# Patient Record
Sex: Female | Born: 1960 | Race: Black or African American | Hispanic: No | Marital: Married | Smoking: Former smoker
Health system: Southern US, Community
[De-identification: ages and names within clinical notes are randomized; demographics above are authoritative.]

## PROBLEM LIST (undated history)

## (undated) DIAGNOSIS — K219 Gastro-esophageal reflux disease without esophagitis: Secondary | ICD-10-CM

## (undated) DIAGNOSIS — R112 Nausea with vomiting, unspecified: Secondary | ICD-10-CM

## (undated) DIAGNOSIS — Z9889 Other specified postprocedural states: Secondary | ICD-10-CM

## (undated) DIAGNOSIS — E119 Type 2 diabetes mellitus without complications: Secondary | ICD-10-CM

## (undated) DIAGNOSIS — E785 Hyperlipidemia, unspecified: Secondary | ICD-10-CM

## (undated) DIAGNOSIS — T7840XA Allergy, unspecified, initial encounter: Secondary | ICD-10-CM

## (undated) HISTORY — DX: Gastro-esophageal reflux disease without esophagitis: K21.9

## (undated) HISTORY — DX: Allergy, unspecified, initial encounter: T78.40XA

## (undated) HISTORY — DX: Other specified postprocedural states: Z98.890

## (undated) HISTORY — DX: Type 2 diabetes mellitus without complications: E11.9

## (undated) HISTORY — DX: Nausea with vomiting, unspecified: R11.2

## (undated) HISTORY — DX: Hyperlipidemia, unspecified: E78.5

---

## 1982-06-24 HISTORY — PX: TUBAL LIGATION: SHX77

## 2012-06-24 HISTORY — PX: LAPAROSCOPIC GASTRIC SLEEVE RESECTION: SHX5895

## 2016-07-08 ENCOUNTER — Encounter: Payer: Managed Care, Other (non HMO) | Attending: Family | Admitting: Registered"

## 2016-07-08 ENCOUNTER — Encounter: Payer: Self-pay | Admitting: Registered"

## 2016-07-08 ENCOUNTER — Encounter: Payer: Managed Care, Other (non HMO) | Admitting: Registered"

## 2016-07-08 DIAGNOSIS — Z713 Dietary counseling and surveillance: Secondary | ICD-10-CM | POA: Diagnosis not present

## 2016-07-08 DIAGNOSIS — E1165 Type 2 diabetes mellitus with hyperglycemia: Secondary | ICD-10-CM | POA: Insufficient documentation

## 2016-07-08 DIAGNOSIS — E119 Type 2 diabetes mellitus without complications: Secondary | ICD-10-CM

## 2016-07-08 NOTE — Progress Notes (Addendum)
Deleted notes. Chose wrong patient to chart visit

## 2016-07-08 NOTE — Patient Instructions (Addendum)
Deleted patient instructions - charted on wrong patient. aj 07/16/2016

## 2016-07-16 NOTE — Progress Notes (Signed)
Diabetes Self-Management Education  Visit Type: First/Initial  Appt. Start Time: 1000 Appt. End Time: 1100  07/08/2016  [name], identified by name and date of birth, is a 56 y.o. female with a diagnosis of Diabetes: Type 2.   ASSESSMENT Patient reports she has made lifestyle changes since her initial diagnosis in November. She started exercising 4x week, cut back on alcohol consumption and joined [employer] end of December. 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      Diabetes Self-Management Education - 07/08/16 1002      Visit Information   Visit Type First/Initial     Initial Visit   Diabetes Type Type 2   Are you currently following a meal plan? Yes   What type of meal plan do you follow? weight watchers freestyle   Are you taking your medications as prescribed? Not on Medications   Date Diagnosed november 2017     Health Coping   How would you rate your overall health? Good     Psychosocial Assessment   Patient Belief/Attitude about Diabetes Motivated to manage diabetes  a little bit of denial   Other persons present Patient     Complications   Last HgB A1C per patient/outside source 9.1 %   How often do you check your blood sugar? 1-2 times/day  morning first thing   Fasting Blood glucose range (mg/dL) 130-179   Have you had a dilated eye exam in the past 12 months? Yes   Have you had a dental exam in the past 12 months? Yes   Are you checking your feet? Yes   How many days per week are you checking your feet? 1  ingrown toenail removed oct 1     Dietary Intake   Breakfast coffee, egg white spinach omelett with cheese OR oatmeal with walnuts, bananas, cinnamon   Snack (morning) nuts and piece of fruit   Lunch left overs OR salad with tuna (biggest meal of day)   Snack (afternoon) none   Dinner (air frying) meat and vegetable and starch OR beans   Snack (evening) fruit / nuts   Beverage(s) coffee, water, crystal  light     Exercise   Exercise Type Moderate (swimming / aerobic walking)   How many days per week to you exercise? 4   How many minutes per day do you exercise? 60   Total minutes per week of exercise 240     Patient Education   Previous Diabetes Education No   Disease state  Definition of diabetes, type 1 and 2, and the diagnosis of diabetes;Factors that contribute to the development of diabetes   Nutrition management  Role of diet in the treatment of diabetes and the relationship between the three main macronutrients and blood glucose level;Food label reading, portion sizes and measuring food.;Carbohydrate counting;Reviewed blood glucose goals for pre and post meals and how to evaluate the patients' food intake on their blood glucose level.   Physical activity and exercise  Role of exercise on diabetes management, blood pressure control and cardiac health.   Monitoring Identified appropriate SMBG and/or A1C goals.;Purpose and frequency of SMBG.;Daily foot exams   Chronic complications Assessed and discussed foot care and prevention of foot problems     Individualized Goals (developed by patient)   Nutrition General guidelines for healthy choices and portions discussed   Physical Activity 45 minutes per day     Outcomes   Expected Outcomes Demonstrated interest  in learning. Expect positive outcomes   Future DMSE PRN   Program Status Completed      Individualized Plan for Diabetes Self-Management Training:   Learning Objective:  Patient will have a greater understanding of diabetes self-management. Patient education plan is to attend individual and/or group sessions per assessed needs and concerns.   Plan:   Patient Instructions  Plan:  Aim for 2-3 Carb Choices per meal Aim for 0-1 Carbs per snack if hungry  Include protein in moderation with your meals and snacks Keep working the carb balance and variety of foods into your Weight Watchers Program Consider reading food labels  for Total Carbohydrate Continue your physical activity daily as tolerated Continue checking BG as directed by MD   Other resources: Look into overnight oats recipe for summertime breakfast   Expected Outcomes:  Demonstrated interest in learning. Expect positive outcomes  Education material provided: Living Well with Diabetes, A1C conversion sheet, Meal plan card and My Plate  If problems or questions, patient to contact team via:  Phone and Email  Future DSME appointment: PRN

## 2016-07-16 NOTE — Patient Instructions (Signed)
Plan:  Aim for 2-3 Carb Choices per meal Aim for 0-1 Carbs per snack if hungry  Include protein in moderation with your meals and snacks Keep working the carb balance and variety of foods into your Weight Watchers Program Consider reading food labels for Total Carbohydrate Continue your physical activity daily as tolerated Continue checking BG as directed by MD   Other resources: Look into overnight oats recipe for summertime breakfast

## 2018-06-19 ENCOUNTER — Other Ambulatory Visit: Payer: Self-pay | Admitting: Nurse Practitioner

## 2018-06-19 DIAGNOSIS — Z1231 Encounter for screening mammogram for malignant neoplasm of breast: Secondary | ICD-10-CM

## 2018-06-19 DIAGNOSIS — Z13820 Encounter for screening for osteoporosis: Secondary | ICD-10-CM

## 2018-07-29 ENCOUNTER — Ambulatory Visit
Admission: RE | Admit: 2018-07-29 | Discharge: 2018-07-29 | Disposition: A | Discharge status: Home / Self Care | Payer: 59 | Source: Ambulatory Visit | Attending: Nurse Practitioner | Admitting: Nurse Practitioner

## 2018-07-29 ENCOUNTER — Ambulatory Visit
Admission: RE | Admit: 2018-07-29 | Discharge: 2018-07-29 | Disposition: A | Discharge status: Home / Self Care | Payer: Managed Care, Other (non HMO) | Source: Ambulatory Visit | Attending: Nurse Practitioner | Admitting: Nurse Practitioner

## 2018-07-29 DIAGNOSIS — Z13820 Encounter for screening for osteoporosis: Secondary | ICD-10-CM

## 2018-07-29 DIAGNOSIS — Z1231 Encounter for screening mammogram for malignant neoplasm of breast: Secondary | ICD-10-CM

## 2018-07-30 ENCOUNTER — Other Ambulatory Visit: Payer: Self-pay | Admitting: Nurse Practitioner

## 2018-07-30 DIAGNOSIS — R928 Other abnormal and inconclusive findings on diagnostic imaging of breast: Secondary | ICD-10-CM

## 2019-06-21 ENCOUNTER — Other Ambulatory Visit: Payer: Self-pay | Admitting: Physician Assistant

## 2019-06-21 ENCOUNTER — Other Ambulatory Visit (HOSPITAL_COMMUNITY)
Admission: RE | Admit: 2019-06-21 | Discharge: 2019-06-21 | Disposition: A | Discharge status: Home / Self Care | Payer: 59 | Source: Ambulatory Visit | Attending: Physician Assistant | Admitting: Physician Assistant

## 2019-06-21 DIAGNOSIS — Z124 Encounter for screening for malignant neoplasm of cervix: Secondary | ICD-10-CM | POA: Insufficient documentation

## 2019-06-24 LAB — CYTOLOGY - PAP: Diagnosis: NEGATIVE

## 2019-09-03 ENCOUNTER — Other Ambulatory Visit: Payer: Self-pay

## 2019-09-03 ENCOUNTER — Other Ambulatory Visit: Payer: Self-pay | Admitting: Physician Assistant

## 2019-09-03 ENCOUNTER — Ambulatory Visit
Admission: RE | Admit: 2019-09-03 | Discharge: 2019-09-03 | Disposition: A | Discharge status: Home / Self Care | Payer: 59 | Source: Ambulatory Visit | Attending: Physician Assistant | Admitting: Physician Assistant

## 2019-09-03 DIAGNOSIS — Z1231 Encounter for screening mammogram for malignant neoplasm of breast: Secondary | ICD-10-CM

## 2019-11-02 ENCOUNTER — Ambulatory Visit (INDEPENDENT_AMBULATORY_CARE_PROVIDER_SITE_OTHER): Payer: 59 | Admitting: Family Medicine

## 2019-11-02 ENCOUNTER — Encounter: Payer: Self-pay | Admitting: Family Medicine

## 2019-11-02 ENCOUNTER — Other Ambulatory Visit: Payer: Self-pay

## 2019-11-02 VITALS — BP 120/70 | HR 67 | Ht 64.0 in | Wt 235.1 lb

## 2019-11-02 DIAGNOSIS — J302 Other seasonal allergic rhinitis: Secondary | ICD-10-CM | POA: Diagnosis not present

## 2019-11-02 DIAGNOSIS — Z7689 Persons encountering health services in other specified circumstances: Secondary | ICD-10-CM

## 2019-11-02 DIAGNOSIS — E119 Type 2 diabetes mellitus without complications: Secondary | ICD-10-CM | POA: Diagnosis not present

## 2019-11-02 MED ORDER — ONE-A-DAY WOMENS PO TABS: 1.0000 | ORAL_TABLET | Freq: Every day | ORAL | 11 refills | Status: AC

## 2019-11-02 MED ORDER — CETIRIZINE HCL 10 MG PO TABS: 10.0000 mg | ORAL_TABLET | Freq: Every day | ORAL | Status: DC

## 2019-11-02 MED ORDER — EMPAGLIFLOZIN 10 MG PO TABS: 10.0000 mg | ORAL_TABLET | Freq: Every day | ORAL | 3 refills | Status: DC

## 2019-11-02 MED ORDER — ASPIRIN EC 81 MG PO TBEC: 81.0000 mg | DELAYED_RELEASE_TABLET | Freq: Every day | ORAL | Status: DC

## 2019-11-02 NOTE — Patient Instructions (Signed)
It was very nice to meet you today. Please enjoy the rest of your week. Today you were seen to establish care and a well exam. Follow up in 3 months for a diabetes check up or sooner if needed.   Please call the clinic at [phone] if your symptoms worsen or you have any concerns. It was our pleasure to serve you.

## 2019-11-02 NOTE — Progress Notes (Signed)
    SUBJECTIVE:   CHIEF COMPLAINT / HPI:   Mrs. Franssen is a 59 yo F that presents to establish care. Her concerns are listed below.  T2DM Average blood sugars are 131 with highs of 145 and lows of 91 according to her meter. She checks her blood sugars daily. She is not taking a statin medication because of the fear of side effects. She denies vision changes, polyphagia, polyuria, polydipsia, and paresthesias.   PERTINENT  PMH / PSH: Seasonal allergies  OBJECTIVE:   BP 120/70   Pulse 67   Ht 5\' 4"  (1.626 m)   Wt 235 lb 2 oz (106.7 kg)   SpO2 97%   BMI 40.36 kg/m    General: Appears well, no acute distress. Age appropriate. Cardiac: RRR, normal heart sounds, no murmurs Respiratory: CTAB, normal effort Extremities: No edema or cyanosis. Skin: Warm and dry, no rashes noted Psych: normal affect  ASSESSMENT/PLAN:   Diabetes mellitus without complication (HCC) Last hgb A1c was 10/18/2019 7.9; goal <7. BMP wnl. LDL 190.  Statin naive. LDL goal is <70. ASCVD risk is 9.9%.  -Continue Jardiance 10mg  daily -Consider metformin, GLP-1 -Counsel on statin use at follow up appt -Follow up in 3 months for diabetes check up  Encounter to establish care Patient UTD on pap, mammogram, and DEXA scan -Signed records release form -Consider referral for colonoscopy if records not available    Gerlene Fee, [place]

## 2019-11-03 LAB — BASIC METABOLIC PANEL
BUN/Creatinine Ratio: 23 (ref 9–23)
BUN: 20 mg/dL (ref 6–24)
CO2: 23 mmol/L (ref 20–29)
Calcium: 9.3 mg/dL (ref 8.7–10.2)
Chloride: 104 mmol/L (ref 96–106)
Creatinine, Ser: 0.88 mg/dL (ref 0.57–1.00)
GFR calc Af Amer: 84 mL/min/{1.73_m2} (ref >59)
GFR calc non Af Amer: 73 mL/min/{1.73_m2} (ref >59)
Glucose: 112 mg/dL — ABNORMAL HIGH (ref 65–99)
Potassium: 4.7 mmol/L (ref 3.5–5.2)
Sodium: 142 mmol/L (ref 134–144)

## 2019-11-03 LAB — LIPID PANEL
Chol/HDL Ratio: 4.3 ratio (ref 0.0–4.4)
Cholesterol, Total: 273 mg/dL — ABNORMAL HIGH (ref 100–199)
HDL: 63 mg/dL (ref >39)
LDL Chol Calc (NIH): 190 mg/dL — ABNORMAL HIGH (ref 0–99)
Triglycerides: 116 mg/dL (ref 0–149)
VLDL Cholesterol Cal: 20 mg/dL (ref 5–40)

## 2019-11-08 DIAGNOSIS — Z7689 Persons encountering health services in other specified circumstances: Secondary | ICD-10-CM | POA: Insufficient documentation

## 2019-11-08 NOTE — Assessment & Plan Note (Signed)
Patient UTD on pap, mammogram, and DEXA scan -Signed records release form -Consider referral for colonoscopy if records not available

## 2019-11-08 NOTE — Assessment & Plan Note (Signed)
Last hgb A1c was 10/18/2019 7.9; goal <7. BMP wnl. LDL 190.  Statin naive. LDL goal is <70. ASCVD risk is 9.9%.  -Continue Jardiance 10mg  daily -Consider metformin, GLP-1 -Counsel on statin use at follow up appt -Follow up in 3 months for diabetes check up

## 2020-10-10 LAB — HM DIABETES EYE EXAM

## 2020-11-11 ENCOUNTER — Other Ambulatory Visit: Payer: Self-pay | Admitting: Family Medicine

## 2020-11-11 DIAGNOSIS — E119 Type 2 diabetes mellitus without complications: Secondary | ICD-10-CM

## 2020-11-13 ENCOUNTER — Other Ambulatory Visit: Payer: Self-pay

## 2020-11-13 ENCOUNTER — Other Ambulatory Visit: Payer: Self-pay | Admitting: Family Medicine

## 2020-11-13 DIAGNOSIS — Z1231 Encounter for screening mammogram for malignant neoplasm of breast: Secondary | ICD-10-CM

## 2020-11-13 NOTE — Telephone Encounter (Signed)
Patient needs appointment. No documented A1c >1 year.   [place], DO 11/13/2020, 10:51 AM PGY-2, [place]

## 2020-12-12 ENCOUNTER — Other Ambulatory Visit: Payer: Self-pay | Admitting: Family Medicine

## 2020-12-12 DIAGNOSIS — E119 Type 2 diabetes mellitus without complications: Secondary | ICD-10-CM

## 2021-01-01 ENCOUNTER — Ambulatory Visit (INDEPENDENT_AMBULATORY_CARE_PROVIDER_SITE_OTHER): Payer: 59 | Admitting: Family Medicine

## 2021-01-01 ENCOUNTER — Other Ambulatory Visit: Payer: Self-pay

## 2021-01-01 VITALS — BP 145/84 | HR 63 | Ht 64.0 in | Wt 238.6 lb

## 2021-01-01 DIAGNOSIS — Z1211 Encounter for screening for malignant neoplasm of colon: Secondary | ICD-10-CM | POA: Diagnosis not present

## 2021-01-01 DIAGNOSIS — E785 Hyperlipidemia, unspecified: Secondary | ICD-10-CM

## 2021-01-01 DIAGNOSIS — Z0000 Encounter for general adult medical examination without abnormal findings: Secondary | ICD-10-CM

## 2021-01-01 DIAGNOSIS — E119 Type 2 diabetes mellitus without complications: Secondary | ICD-10-CM

## 2021-01-01 DIAGNOSIS — Z23 Encounter for immunization: Secondary | ICD-10-CM

## 2021-01-01 DIAGNOSIS — R030 Elevated blood-pressure reading, without diagnosis of hypertension: Secondary | ICD-10-CM | POA: Diagnosis not present

## 2021-01-01 DIAGNOSIS — E1169 Type 2 diabetes mellitus with other specified complication: Secondary | ICD-10-CM

## 2021-01-01 LAB — POCT GLYCOSYLATED HEMOGLOBIN (HGB A1C): HbA1c, POC (controlled diabetic range): 9 % — AB (ref 0.0–7.0)

## 2021-01-01 MED ORDER — SHINGRIX 50 MCG/0.5ML IM SUSR: 0.5000 mL | Freq: Once | INTRAMUSCULAR | 0 refills | Status: AC

## 2021-01-01 MED ORDER — OZEMPIC (0.25 OR 0.5 MG/DOSE) 2 MG/1.5ML ~~LOC~~ SOPN: 0.2500 mg | PEN_INJECTOR | SUBCUTANEOUS | 1 refills | Status: DC

## 2021-01-01 MED ORDER — BD PEN NEEDLE MINI U/F 31G X 5 MM MISC: 1.0000 | 11 refills | Status: DC | PRN
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
## 2021-01-01 NOTE — Patient Instructions (Addendum)
Today we discussed we will start semaglutide 0.25 mg weekly.  He will follow-up with Ernst Bowler our pharmacist in 1 month.  I have referred you for colonoscopy.  Please take shingles prescription to pharmacy to get vaccine.  Follow-up with me in 3 months for repeat A1c.  Your blood pressure was a little elevated today we will recheck at follow-up.  Dr. Janus Molder

## 2021-01-01 NOTE — Progress Notes (Signed)
Asked by Dr. Janus Molder to provide education on Ozempic pen injection.   Patient educated on purpose, proper use and potential adverse effects of Ozempic (semaglutide).  Following instruction patient verbalized understanding of treatment plan. Patient was able to demonstrate appropriate technique - she states her husband will be providing the injection as she has some fear of needles/injection (8/10).     New prescription for insulin pen needles sent to pharmacy.

## 2021-01-02 LAB — LIPID PANEL
Chol/HDL Ratio: 4 ratio (ref 0.0–4.4)
Cholesterol, Total: 283 mg/dL — ABNORMAL HIGH (ref 100–199)
HDL: 70 mg/dL (ref >39)
LDL Chol Calc (NIH): 198 mg/dL — ABNORMAL HIGH (ref 0–99)
Triglycerides: 92 mg/dL (ref 0–149)
VLDL Cholesterol Cal: 15 mg/dL (ref 5–40)

## 2021-01-02 LAB — HIV ANTIBODY (ROUTINE TESTING W REFLEX): HIV Screen 4th Generation wRfx: NONREACTIVE

## 2021-01-02 LAB — HCV AB W REFLEX TO QUANT PCR: HCV Ab: <0.1 s/co ratio (ref 0.0–0.9)

## 2021-01-02 LAB — HCV INTERPRETATION

## 2021-01-03 LAB — BASIC METABOLIC PANEL
BUN/Creatinine Ratio: 19 (ref 9–23)
BUN: 16 mg/dL (ref 6–24)
CO2: 19 mmol/L — ABNORMAL LOW (ref 20–29)
Calcium: 9.4 mg/dL (ref 8.7–10.2)
Chloride: 101 mmol/L (ref 96–106)
Creatinine, Ser: 0.85 mg/dL (ref 0.57–1.00)
Glucose: 123 mg/dL — ABNORMAL HIGH (ref 65–99)
Potassium: 4.4 mmol/L (ref 3.5–5.2)
Sodium: 139 mmol/L (ref 134–144)
eGFR: 79 mL/min/{1.73_m2} (ref >59)

## 2021-01-03 LAB — SPECIMEN STATUS REPORT

## 2021-01-04 DIAGNOSIS — R030 Elevated blood-pressure reading, without diagnosis of hypertension: Secondary | ICD-10-CM | POA: Insufficient documentation

## 2021-01-04 NOTE — Assessment & Plan Note (Signed)
BP reviewed and initially elevated at 130/80. Continued to be elevated upon recheck as above. No diagnosis of HTN. States is normal at home and usually high in the office. Asymptomatic. Will recheck at follow up. Return to care precautions discussed.

## 2021-01-04 NOTE — Assessment & Plan Note (Addendum)
A1c today 9.0. Up from 7.9. Patient is taking Jardiance 10 mg daily. She refused to take metformin due to GI upset in the past. Open to injectables. Discussed starting ozempic with Dr. Valentina Lucks. Appreciate his note. Discussed diet and physical activity with patient as well.  -Start semaglutide 0.25mg  weekly - Continue jardiance 10 mg daily; consider maxing out this medication if needed for tighter control - HM Diabetes Foot Exam - Basic Metabolic Panel - Lipid Panel -Follow up in 1 month with pharmacy to titrate up on ozempic

## 2021-01-08 ENCOUNTER — Encounter: Payer: Self-pay | Admitting: Gastroenterology

## 2021-01-08 ENCOUNTER — Telehealth: Payer: Self-pay | Admitting: Gastroenterology

## 2021-01-08 NOTE — Telephone Encounter (Signed)
Opened in error

## 2021-01-09 ENCOUNTER — Other Ambulatory Visit: Payer: Self-pay

## 2021-01-09 ENCOUNTER — Ambulatory Visit
Admission: RE | Admit: 2021-01-09 | Discharge: 2021-01-09 | Disposition: A | Discharge status: Home / Self Care | Payer: 59 | Source: Ambulatory Visit

## 2021-01-09 DIAGNOSIS — Z1231 Encounter for screening mammogram for malignant neoplasm of breast: Secondary | ICD-10-CM

## 2021-01-11 ENCOUNTER — Telehealth: Payer: Self-pay | Admitting: Family Medicine

## 2021-01-11 DIAGNOSIS — E119 Type 2 diabetes mellitus without complications: Secondary | ICD-10-CM

## 2021-01-11 DIAGNOSIS — E785 Hyperlipidemia, unspecified: Secondary | ICD-10-CM

## 2021-01-11 DIAGNOSIS — E1169 Type 2 diabetes mellitus with other specified complication: Secondary | ICD-10-CM

## 2021-01-11 MED ORDER — ATORVASTATIN CALCIUM 40 MG PO TABS: 40.0000 mg | ORAL_TABLET | Freq: Every day | ORAL | 3 refills | Status: DC

## 2021-01-11 NOTE — Addendum Note (Signed)
Addended by: Caralee Ates on: 01/11/2021 10:41 AM   Modules accepted: Orders

## 2021-01-11 NOTE — Telephone Encounter (Signed)
Called patient to discuss cholesterol panel.  We will start Lipitor 40 mg daily.  Answered questions of concern such as side effects and benefits. Discussed follow-up in 3 months we will assess A1c, how she is doing starting the statin, as well as insurance coverage for shingles vaccine.  Peng Thorstenson Autry-Lott, DO 01/11/2021, 10:39 AM PGY-3, [place]

## 2021-01-13 ENCOUNTER — Other Ambulatory Visit: Payer: Self-pay | Admitting: Family Medicine

## 2021-01-13 DIAGNOSIS — E119 Type 2 diabetes mellitus without complications: Secondary | ICD-10-CM

## 2021-02-18 ENCOUNTER — Other Ambulatory Visit: Payer: Self-pay | Admitting: Family Medicine

## 2021-02-18 DIAGNOSIS — E119 Type 2 diabetes mellitus without complications: Secondary | ICD-10-CM

## 2021-03-01 ENCOUNTER — Other Ambulatory Visit: Payer: Self-pay | Admitting: Family Medicine

## 2021-03-01 DIAGNOSIS — E119 Type 2 diabetes mellitus without complications: Secondary | ICD-10-CM

## 2021-03-09 ENCOUNTER — Encounter: Payer: 59 | Admitting: Gastroenterology

## 2021-03-09 ENCOUNTER — Encounter: Payer: Self-pay | Admitting: *Deleted

## 2021-03-18 ENCOUNTER — Other Ambulatory Visit: Payer: Self-pay | Admitting: Family Medicine

## 2021-03-18 DIAGNOSIS — E119 Type 2 diabetes mellitus without complications: Secondary | ICD-10-CM

## 2021-03-27 ENCOUNTER — Ambulatory Visit (AMBULATORY_SURGERY_CENTER): Payer: 59 | Admitting: *Deleted

## 2021-03-27 ENCOUNTER — Other Ambulatory Visit: Payer: Self-pay

## 2021-03-27 VITALS — Ht 64.0 in | Wt 235.0 lb

## 2021-03-27 DIAGNOSIS — Z1211 Encounter for screening for malignant neoplasm of colon: Secondary | ICD-10-CM

## 2021-03-27 MED ORDER — CLENPIQ 10-3.5-12 MG-GM -GM/160ML PO SOLN: 1.0000 | ORAL | 0 refills | Status: DC

## 2021-03-27 NOTE — Progress Notes (Signed)
No egg or soy allergy known to patient  No issues known to pt with past sedation with any surgeries or procedures   Patient denies ever being told they had issues or difficulty with intubation  No FH of Malignant Hyperthermia Pt is not on diet pills Pt is not on  home 02  Pt is not on blood thinners  Pt denies issues with constipation  No A fib or A flutter   Pt is fully vaccinated  for Covid   clenpiq Coupon given to pt in PV today , Code to Pharmacy and  NO PA's for preps discussed with pt In PV today  Discussed with pt there will be an out-of-pocket cost for prep and that varies from $0 to 70 +  dollars   Due to the COVID-19 pandemic we are asking patients to follow certain guidelines.  Pt aware of COVID protocols and [facility] guidelines   Pt verified name, DOB, address and insurance during PV today.  Pt mailed instruction packet of Emmi video, copy of consent form to read and not return, and instructions.  Clenpiq coupon mailed in packet.  PV completed over the phone.  Pt encouraged to call with questions or issues.  My Chart instructions to pt as well

## 2021-03-30 ENCOUNTER — Encounter: Payer: Self-pay | Admitting: Gastroenterology

## 2021-04-09 ENCOUNTER — Ambulatory Visit (AMBULATORY_SURGERY_CENTER): Payer: 59 | Admitting: Gastroenterology

## 2021-04-09 ENCOUNTER — Encounter: Payer: Self-pay | Admitting: Gastroenterology

## 2021-04-09 VITALS — BP 160/98 | HR 67 | Temp 97.5°F | Resp 14 | Ht 64.0 in | Wt 235.0 lb

## 2021-04-09 DIAGNOSIS — D128 Benign neoplasm of rectum: Secondary | ICD-10-CM

## 2021-04-09 DIAGNOSIS — K621 Rectal polyp: Secondary | ICD-10-CM | POA: Diagnosis not present

## 2021-04-09 DIAGNOSIS — Z1211 Encounter for screening for malignant neoplasm of colon: Secondary | ICD-10-CM | POA: Diagnosis present

## 2021-04-09 MED ORDER — SODIUM CHLORIDE 0.9 % IV SOLN: 500.0000 mL | Freq: Once | INTRAVENOUS | Status: DC

## 2021-04-09 MED ORDER — FLEET ENEMA 7-19 GM/118ML RE ENEM
1.0000 | ENEMA | Freq: Once | RECTAL | Status: AC
  Administered 2021-04-09: 1 via RECTAL

## 2021-04-09 NOTE — Patient Instructions (Signed)
Information on polyps given to you today.  Await pathology results.  Resume previous diet and medications.  YOU HAD AN ENDOSCOPIC PROCEDURE TODAY AT THE [facility]:   Refer to the procedure report that was given to you for any specific questions about what was found during the examination.  If the procedure report does not answer your questions, please call your gastroenterologist to clarify.  If you requested that your care partner not be given the details of your procedure findings, then the procedure report has been included in a sealed envelope for you to review at your convenience later.  YOU SHOULD EXPECT: Some feelings of bloating in the abdomen. Passage of more gas than usual.  Walking can help get rid of the air that was put into your GI tract during the procedure and reduce the bloating. If you had a lower endoscopy (such as a colonoscopy or flexible sigmoidoscopy) you may notice spotting of blood in your stool or on the toilet paper. If you underwent a bowel prep for your procedure, you may not have a normal bowel movement for a few days.  Please Note:  You might notice some irritation and congestion in your nose or some drainage.  This is from the oxygen used during your procedure.  There is no need for concern and it should clear up in a day or so.  SYMPTOMS TO REPORT IMMEDIATELY:   Following lower endoscopy (colonoscopy or flexible sigmoidoscopy):  Excessive amounts of blood in the stool  Significant tenderness or worsening of abdominal pains  Swelling of the abdomen that is new, acute  Fever of 100F or higher   For urgent or emergent issues, a gastroenterologist can be reached at any hour by calling [phone]. Do not use MyChart messaging for urgent concerns.    DIET:  We do recommend a small meal at first, but then you may proceed to your regular diet.  Drink plenty of fluids but you should avoid alcoholic beverages for 24 hours.  ACTIVITY:  You should  plan to take it easy for the rest of today and you should NOT DRIVE or use heavy machinery until tomorrow (because of the sedation medicines used during the test).    FOLLOW UP: Our staff will call the number listed on your records 48-72 hours following your procedure to check on you and address any questions or concerns that you may have regarding the information given to you following your procedure. If we do not reach you, we will leave a message.  We will attempt to reach you two times.  During this call, we will ask if you have developed any symptoms of COVID 19. If you develop any symptoms (ie: fever, flu-like symptoms, shortness of breath, cough etc.) before then, please call [phone].  If you test positive for Covid 19 in the 2 weeks post procedure, please call and report this information to us.    If any biopsies were taken you will be contacted by phone or by letter within the next 1-3 weeks.  Please call us at [phone] if you have not heard about the biopsies in 3 weeks.    SIGNATURES/CONFIDENTIALITY: You and/or your care partner have signed paperwork which will be entered into your electronic medical record.  These signatures attest to the fact that that the information above on your After Visit Summary has been reviewed and is understood.  Full responsibility of the confidentiality of this discharge information lies with you and/or your care-partner. 

## 2021-04-09 NOTE — Progress Notes (Signed)
To PACU, VSS. Report to [email or web address].tb 

## 2021-04-09 NOTE — Progress Notes (Signed)
Called to room to assist during endoscopic procedure.  Patient ID and intended procedure confirmed with present staff. Received instructions for my participation in the procedure from the performing physician.  

## 2021-04-09 NOTE — Progress Notes (Signed)
Pt's states no medical or surgical changes since previsit or office visit.  Verbal order received to give enema x1,given in admitting results was clear with fine small pieces at bottom of toilet.  Check-in-AM  V/S-CW

## 2021-04-09 NOTE — Op Note (Signed)
[place] Patient Name: Shelley Klein Procedure Date: 04/09/2021 10:53 AM MRN: [id number] Endoscopist: Nicki Reaper E. Candis Schatz , MD Age: 60 Referring MD:  Date of Birth: Nov 01, 1960 Gender: Female Account #: [id number] Procedure:                Colonoscopy Indications:              Screening for colorectal malignant neoplasm, This                            is the patient's first colonoscopy Medicines:                Monitored Anesthesia Care Procedure:                Pre-Anesthesia Assessment:                           - Prior to the procedure, a History and Physical                            was performed, and patient medications and                            allergies were reviewed. The patient's tolerance of                            previous anesthesia was also reviewed. The risks                            and benefits of the procedure and the sedation                            options and risks were discussed with the patient.                            All questions were answered, and informed consent                            was obtained. Prior Anticoagulants: The patient has                            taken no previous anticoagulant or antiplatelet                            agents. ASA Grade Assessment: II - A patient with                            mild systemic disease. After reviewing the risks                            and benefits, the patient was deemed in                            satisfactory condition to undergo the procedure.  After obtaining informed consent, the colonoscope                            was passed under direct vision. Throughout the                            procedure, the patient's blood pressure, pulse, and                            oxygen saturations were monitored continuously. The                            CF [id number] #[id number] was introduced through the anus                            and advanced  to the the terminal ileum, with                            identification of the appendiceal orifice and IC                            valve. The colonoscopy was performed without                            difficulty. The patient tolerated the procedure                            well. The quality of the bowel preparation was                            adequate. The terminal ileum, ileocecal valve,                            appendiceal orifice, and rectum were photographed. Scope In: 10:59:43 AM Scope Out: 11:19:06 AM Scope Withdrawal Time: 0 hours 14 minutes 5 seconds  Total Procedure Duration: 0 hours 19 minutes 23 seconds  Findings:                 The perianal and digital rectal examinations were                            normal. Pertinent negatives include normal                            sphincter tone and no palpable rectal lesions.                           A 3 mm polyp was found in the rectum. The polyp was                            sessile. The polyp was removed with a cold snare.                            Resection and  retrieval were complete. Estimated                            blood loss was minimal.                           The exam was otherwise normal throughout the                            examined colon.                           The terminal ileum appeared normal.                           The retroflexed view of the distal rectum and anal                            verge was normal and showed no anal or rectal                            abnormalities. Complications:            No immediate complications. Estimated Blood Loss:     Estimated blood loss was minimal. Impression:               - One 3 mm polyp in the rectum, removed with a cold                            snare. Resected and retrieved.                           - The examined portion of the ileum was normal.                           - The distal rectum and anal verge are normal on                             retroflexion view. Recommendation:           - Patient has a contact number available for                            emergencies. The signs and symptoms of potential                            delayed complications were discussed with the                            patient. Return to normal activities tomorrow.                            Written discharge instructions were provided to the                            patient.                           -  Resume previous diet.                           - Continue present medications.                           - Await pathology results.                           - Repeat colonoscopy (date not yet determined) for                            surveillance based on pathology results. Avarae Zwart E. Candis Schatz, MD 04/09/2021 11:23:20 AM This report has been signed electronically.

## 2021-04-09 NOTE — Progress Notes (Signed)
[facility] History and Physical   Primary Care Physician:  Gerlene Fee, DO   Reason for Procedure:   Colon cancer screening  Plan:    Screening colonoscopy     HPI: Shelley Klein is a 60 y.o. female undergoing initial average risk screening colonoscopy.  She has no chronic GI symptoms and no family history of colon cancer.   Past Medical History:  Diagnosis Date   Allergy    Diabetes mellitus without complication ([place])    type II   GERD (gastroesophageal reflux disease)    prn TUMS with spicy foods   Hyperlipidemia    PONV (postoperative nausea and vomiting)     Past Surgical History:  Procedure Laterality Date   LAPAROSCOPIC GASTRIC SLEEVE RESECTION  2014   TUBAL LIGATION  1984    Prior to Admission medications   Medication Sig Start Date End Date Taking? Authorizing Provider  atorvastatin (LIPITOR) 40 MG tablet Take 1 tablet (40 mg total) by mouth daily. 01/11/21  Yes Autry-Lott, Simone, DO  JARDIANCE 10 MG TABS tablet TAKE 1 TABLET BY MOUTH EVERY DAY 03/19/21  Yes Autry-Lott, Naaman Plummer, DO  cetirizine (ZYRTEC ALLERGY) 10 MG tablet Take 1 tablet (10 mg total) by mouth daily. Patient not taking: No sig reported 11/02/19   Autry-Lott, Naaman Plummer, DO  Insulin Pen Needle (B-D UF III MINI PEN NEEDLES) 31G X 5 MM MISC 1 Container by Does not apply route as needed. 01/01/21   Zenia Resides, MD  OZEMPIC, 0.25 OR 0.5 MG/DOSE, 2 MG/1.5ML SOPN INJECT 0.25MG  INTO THE SKIN ONE TIME PER WEEK 03/01/21   Autry-Lott, Naaman Plummer, DO    Current Outpatient Medications  Medication Sig Dispense Refill   atorvastatin (LIPITOR) 40 MG tablet Take 1 tablet (40 mg total) by mouth daily. 90 tablet 3   JARDIANCE 10 MG TABS tablet TAKE 1 TABLET BY MOUTH EVERY DAY 90 tablet 0   cetirizine (ZYRTEC ALLERGY) 10 MG tablet Take 1 tablet (10 mg total) by mouth daily. (Patient not taking: No sig reported)     Insulin Pen Needle (B-D UF III MINI PEN NEEDLES) 31G X 5 MM MISC 1 Container by Does  not apply route as needed. 100 each 11   OZEMPIC, 0.25 OR 0.5 MG/DOSE, 2 MG/1.5ML SOPN INJECT 0.25MG  INTO THE SKIN ONE TIME PER WEEK 1.5 mL 1   Current Facility-Administered Medications  Medication Dose Route Frequency Provider Last Rate Last Admin   0.9 %  sodium chloride infusion  500 mL Intravenous Once Daryel November, MD        Allergies as of 04/09/2021 - Review Complete 04/09/2021  Allergen Reaction Noted   Shellfish-derived products Swelling 09/08/2012   Amoxicillin-pot clavulanate Rash 05/26/2012    Family History  Problem Relation Age of Onset   Diabetes Mother    High blood pressure Mother    Stroke Mother    Diabetes Sister        several   High blood pressure Sister        several   Drug abuse Sister        one sister with addiction   Prostate cancer Brother    Diabetes Brother        several   High blood pressure Brother        several   Kidney disease Brother        one brother   Colon cancer Neg Hx    Colon polyps Neg Hx    Esophageal cancer Neg Hx  Rectal cancer Neg Hx    Stomach cancer Neg Hx     Social History   Socioeconomic History   Marital status: Married    Spouse name: Not on file   Number of children: Not on file   Years of education: Not on file   Highest education level: Not on file  Occupational History    Comment: [employer]  Tobacco Use   Smoking status: Former    Types: Cigarettes    Quit date: 05/24/1996    Years since quitting: 24.8   Smokeless tobacco: Never  Substance and Sexual Activity   Alcohol use: Yes    Comment: 3, wine and liquor   Drug use: Never   Sexual activity: Not on file  Other Topics Concern   Not on file  Social History Narrative   Not on file   Social Determinants of Health   Financial Resource Strain: Not on file  Food Insecurity: Not on file  Transportation Needs: Not on file  Physical Activity: Not on file  Stress: Not on file  Social Connections: Not on file  Intimate  Partner Violence: Not on file    Review of Systems:  All other review of systems negative except as mentioned in the HPI.  Physical Exam: Vital signs BP (!) 144/91 (Patient Position: Sitting)   Pulse 74   Temp (!) 97.5 F (36.4 C)   Ht 5\' 4"  (1.626 m)   Wt 235 lb (106.6 kg)   SpO2 99%   BMI 40.34 kg/m   General:   Alert,  Well-developed, well-nourished, pleasant and cooperative in NAD Lungs:  Clear throughout to auscultation.   Heart:  Regular rate and rhythm; no murmurs, clicks, rubs,  or gallops. Abdomen:  Soft, nontender and nondistended. Normal bowel sounds.   Neuro/Psych:  Normal mood and affect. A and O x 3   Argelia Formisano E. Candis Schatz, MD [facility]

## 2021-04-11 ENCOUNTER — Telehealth: Payer: Self-pay

## 2021-04-11 NOTE — Telephone Encounter (Signed)
  Follow up Call-  Call back number 04/09/2021  Post procedure Call Back phone  # [phone]  Permission to leave phone message Yes  Some recent data might be hidden     Patient questions:  Do you have a fever, pain , or abdominal swelling? No. Pain Score  0 *  Have you tolerated food without any problems? Yes.    Have you been able to return to your normal activities? Yes.    Do you have any questions about your discharge instructions: Diet   No. Medications  No. Follow up visit  No.  Do you have questions or concerns about your Care? No.  Actions: * If pain score is 4 or above: No action needed, pain <4.

## 2021-04-17 ENCOUNTER — Encounter: Payer: Self-pay | Admitting: Gastroenterology

## 2021-04-26 ENCOUNTER — Other Ambulatory Visit: Payer: Self-pay | Admitting: Family Medicine

## 2021-04-26 DIAGNOSIS — E119 Type 2 diabetes mellitus without complications: Secondary | ICD-10-CM

## 2021-06-21 ENCOUNTER — Other Ambulatory Visit: Payer: Self-pay | Admitting: Family Medicine

## 2021-06-21 DIAGNOSIS — E119 Type 2 diabetes mellitus without complications: Secondary | ICD-10-CM

## 2021-06-27 ENCOUNTER — Other Ambulatory Visit: Payer: Self-pay | Admitting: Family Medicine

## 2021-06-27 DIAGNOSIS — E119 Type 2 diabetes mellitus without complications: Secondary | ICD-10-CM

## 2021-08-25 ENCOUNTER — Other Ambulatory Visit: Payer: Self-pay | Admitting: Family Medicine

## 2021-08-25 DIAGNOSIS — E119 Type 2 diabetes mellitus without complications: Secondary | ICD-10-CM

## 2021-09-20 ENCOUNTER — Other Ambulatory Visit: Payer: Self-pay | Admitting: Family Medicine

## 2021-09-20 DIAGNOSIS — E119 Type 2 diabetes mellitus without complications: Secondary | ICD-10-CM

## 2021-11-27 ENCOUNTER — Other Ambulatory Visit: Payer: Self-pay | Admitting: Family Medicine

## 2021-11-27 ENCOUNTER — Encounter: Payer: Self-pay | Admitting: *Deleted

## 2021-11-27 DIAGNOSIS — Z1231 Encounter for screening mammogram for malignant neoplasm of breast: Secondary | ICD-10-CM

## 2021-12-22 ENCOUNTER — Other Ambulatory Visit: Payer: Self-pay | Admitting: Family Medicine

## 2021-12-22 DIAGNOSIS — E119 Type 2 diabetes mellitus without complications: Secondary | ICD-10-CM

## 2022-01-03 ENCOUNTER — Other Ambulatory Visit: Payer: Self-pay | Admitting: Family Medicine

## 2022-01-03 DIAGNOSIS — E119 Type 2 diabetes mellitus without complications: Secondary | ICD-10-CM

## 2022-01-03 DIAGNOSIS — E1169 Type 2 diabetes mellitus with other specified complication: Secondary | ICD-10-CM

## 2022-01-04 IMAGING — MG MM DIGITAL SCREENING BILAT W/ TOMO AND CAD
8 series · 8 of 24 positions shown · non-contrast
Comparison: Previous exam(s).

CLINICAL DATA: Screening.

EXAM:
DIGITAL SCREENING BILATERAL MAMMOGRAM WITH TOMOSYNTHESIS AND CAD
TECHNIQUE: Bilateral screening digital craniocaudal and mediolateral oblique
mammograms were obtained. Bilateral screening digital breast
tomosynthesis was performed. The images were evaluated with
computer-aided detection.

[L CC synth-2D]
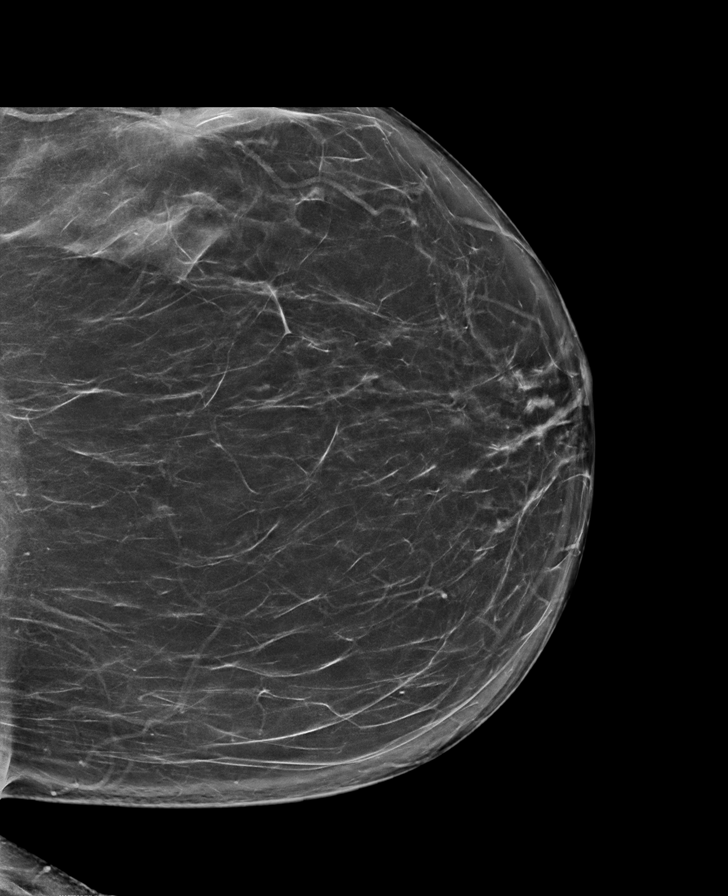

[R MLO synth-2D]
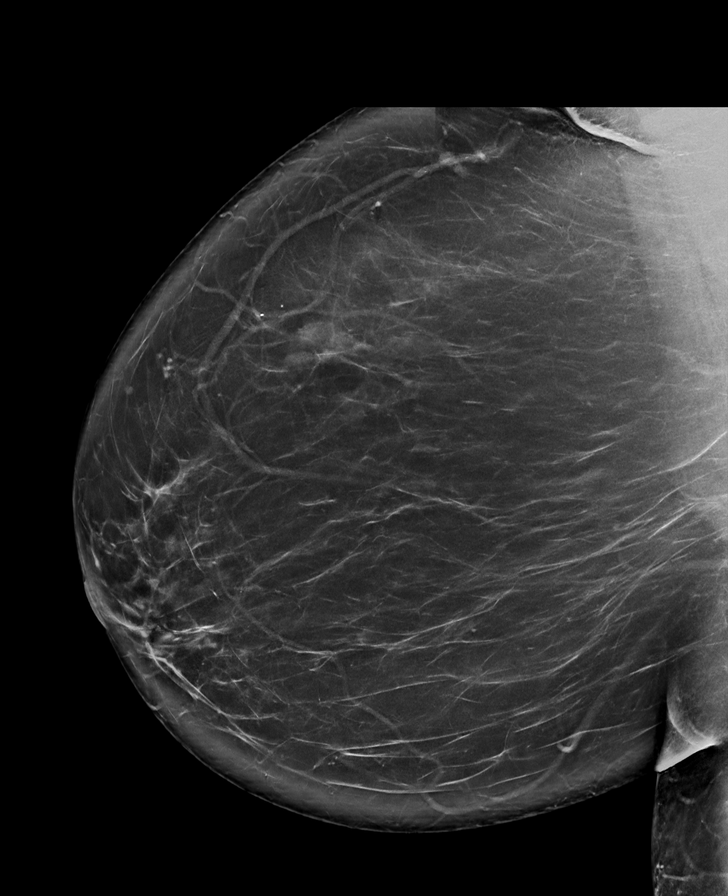

[L MLO synth-2D]
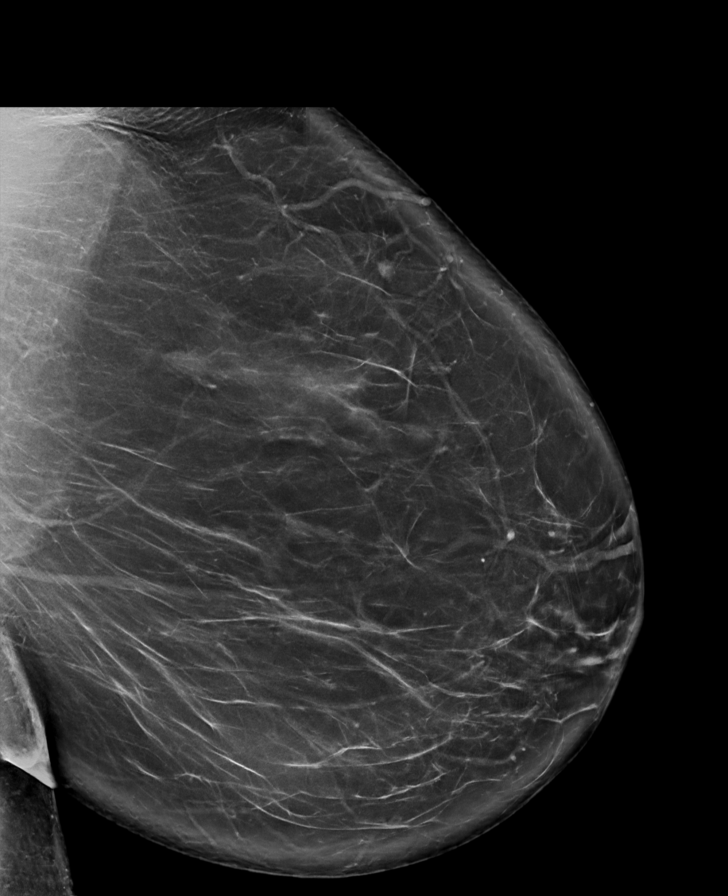

[R CC synth-2D]
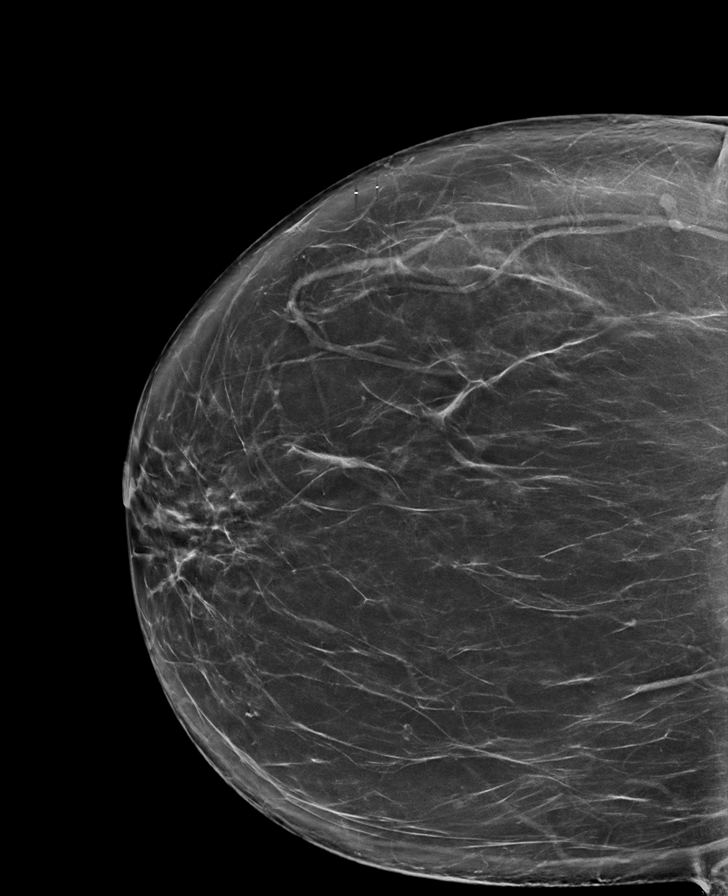

[R MLO tomo · tomo slice 53/104.0]
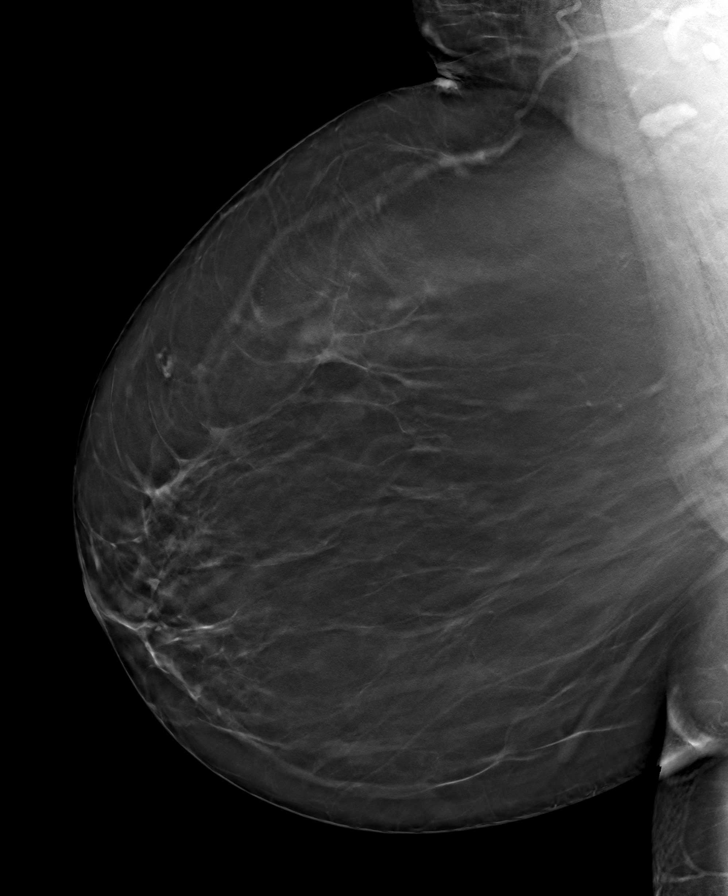

[R CC tomo · tomo slice 46/91.0]
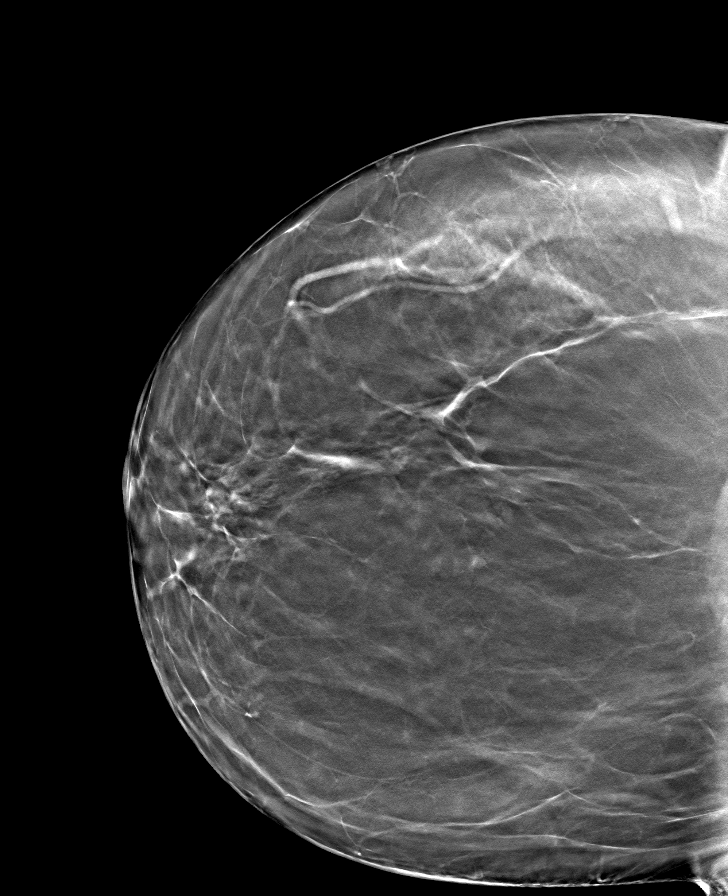

[L MLO tomo · tomo slice 53/105.0]
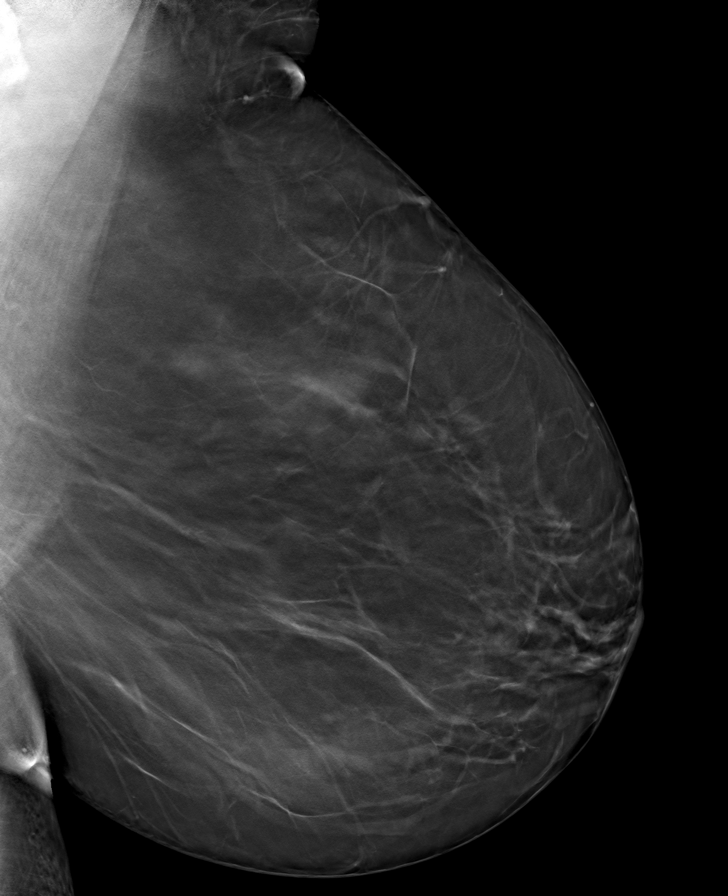

[L CC tomo · tomo slice 44/87.0]
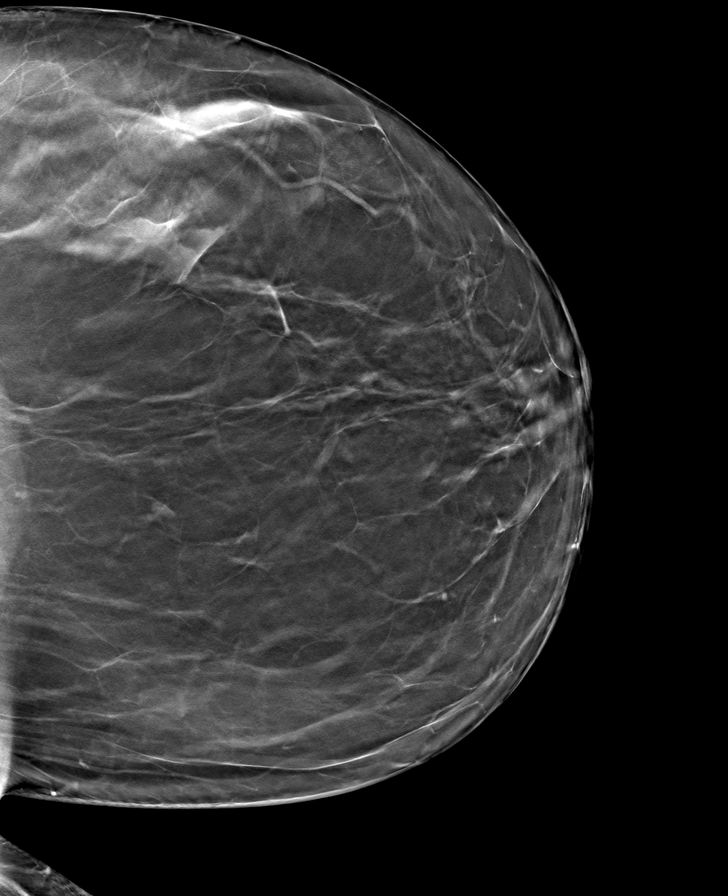

[8 of 24 positions shown; findings below may reference images not displayed]

ACR Breast Density Category b: There are scattered areas of
fibroglandular density.
FINDINGS: There are no findings suspicious for malignancy.
IMPRESSION: No mammographic evidence of malignancy. A result letter of this
screening mammogram will be mailed directly to the patient.

RECOMMENDATION:
Screening mammogram in one year. (Code:51-O-LD2)

BI-RADS CATEGORY  1: Negative.

## 2022-01-10 ENCOUNTER — Ambulatory Visit
Admission: RE | Admit: 2022-01-10 | Discharge: 2022-01-10 | Disposition: A | Discharge status: Home / Self Care | Payer: 59 | Source: Ambulatory Visit | Attending: Family Medicine | Admitting: Family Medicine

## 2022-01-10 DIAGNOSIS — Z1231 Encounter for screening mammogram for malignant neoplasm of breast: Secondary | ICD-10-CM

## 2022-03-04 ENCOUNTER — Other Ambulatory Visit: Payer: Self-pay | Admitting: Family Medicine

## 2022-03-04 DIAGNOSIS — E119 Type 2 diabetes mellitus without complications: Secondary | ICD-10-CM

## 2022-03-05 ENCOUNTER — Ambulatory Visit (INDEPENDENT_AMBULATORY_CARE_PROVIDER_SITE_OTHER): Payer: 59 | Admitting: Student

## 2022-03-05 ENCOUNTER — Encounter: Payer: Self-pay | Admitting: Student

## 2022-03-05 VITALS — BP 154/77 | HR 68 | Ht 64.0 in | Wt 240.4 lb

## 2022-03-05 DIAGNOSIS — E119 Type 2 diabetes mellitus without complications: Secondary | ICD-10-CM

## 2022-03-05 DIAGNOSIS — R030 Elevated blood-pressure reading, without diagnosis of hypertension: Secondary | ICD-10-CM

## 2022-03-05 DIAGNOSIS — Z0000 Encounter for general adult medical examination without abnormal findings: Secondary | ICD-10-CM | POA: Diagnosis not present

## 2022-03-05 DIAGNOSIS — E785 Hyperlipidemia, unspecified: Secondary | ICD-10-CM | POA: Diagnosis not present

## 2022-03-05 LAB — POCT GLYCOSYLATED HEMOGLOBIN (HGB A1C): HbA1c, POC (controlled diabetic range): 7.5 % — AB (ref 0.0–7.0)

## 2022-03-05 MED ORDER — OZEMPIC (0.25 OR 0.5 MG/DOSE) 2 MG/1.5ML ~~LOC~~ SOPN: 0.5000 mg | PEN_INJECTOR | SUBCUTANEOUS | 1 refills | Status: DC

## 2022-03-05 MED ORDER — ROSUVASTATIN CALCIUM 20 MG PO TABS: 20.0000 mg | ORAL_TABLET | Freq: Every day | ORAL | 3 refills | Status: DC

## 2022-03-05 NOTE — Progress Notes (Signed)
    SUBJECTIVE:   Chief compliant/HPI: annual examination  Shelley Klein is a 61 y.o. who presents today for an annual exam.   Hyperlipidemia Pt currently takes Atorvastatin 40 mg. She states she has muscle weakness which she associates with the statin. She briefly quit taking the statin and the symptom resolved and started back when she started taking the statin again. She is interested in trying a different statin.   T2DM Last HgbA1c of 9 on 7/11. Pt currently takes Jardiance 10 mg daily and Ozempic 0.25 mg weekly.   OBJECTIVE:   Vitals:   03/05/22 1529  BP: (!) 154/77  Pulse: 68  SpO2: 100%     ASSESSMENT/PLAN:   Elevated blood pressure reading BP elevated in the office today to 154/77.  Repeat with systolic of 588.  Patient elects to check her blood pressure at home with her automated cuff.  She plans to make a nurse only visit to have her blood pressure checked in the office within the next week and she will bring her home blood pressure cuff to make sure that it is accurate.  She is advised if she has readings consistently over 140/90 she needs to return to be evaluated for hypertension and further discuss potentially starting blood pressure lowering medication.  Hyperlipidemia As patient is having muscle weakness with atorvastatin, will trial her on Crestor 20 mg. -Lipid panel -CMP  Diabetes mellitus without complication (HCC) Hemoglobin A1c is 7.5 today, goal of less than 7.  Will increase Ozempic to 0.5 mg injected weekly with plan to further increase to 1 mg for weight loss benefits as well after 4 weeks if she tolerates the 0.5 dosing.    T2DM Hgb A1c 7.5 today  Annual Examination  See AVS for age appropriate recommendations  PHQ score 0  Considered the following items based upon USPSTF recommendations: Screening for elevated cholesterol: ordered HIV testing:  Negative in 2022 Hepatitis C:  Negative in 2022 Hepatitis B: discussed Syphilis if at high  risk: discussed GC/CT not at high risk and not ordered. Reviewed risk factors for latent tuberculosis and not indicated Cervical cancer screening: prior Pap reviewed, repeat due in 05/2022 Breast cancer screening:  normal in 12/2021. Next due in 2025 Colorectal cancer screening: up to date on screening for CRC. Pt completed both shingrix vaccines, last one 02/26/22.   Follow up in 3 months to check hemoglobin A1c, do foot exam, needs ophthalmology eye exam   Shelley Klein, [place]

## 2022-03-05 NOTE — Assessment & Plan Note (Signed)
BP elevated in the office today to 154/77.  Repeat with systolic of 288.  Patient elects to check her blood pressure at home with her automated cuff.  She plans to make a nurse only visit to have her blood pressure checked in the office within the next week and she will bring her home blood pressure cuff to make sure that it is accurate.  She is advised if she has readings consistently over 140/90 she needs to return to be evaluated for hypertension and further discuss potentially starting blood pressure lowering medication.

## 2022-03-05 NOTE — Patient Instructions (Signed)
It was great to see you! Thank you for allowing me to participate in your care!   Our plans for today:  - I have switched you from atorvastatin to Crestor to see if this helps with muscle weakness. If it does not, please return to discuss other options - Return in 3 months to re check your hemoglobin A1c  We are checking some labs today, I will call you if they are abnormal will send you a MyChart message or a letter if they are normal.  If you do not hear about your labs in the next 2 weeks please let us know.  Take care and seek immediate care sooner if you develop any concerns.   Dr. [name], DO [facility]

## 2022-03-05 NOTE — Assessment & Plan Note (Signed)
Hemoglobin A1c is 7.5 today, goal of less than 7.  Will increase Ozempic to 0.5 mg injected weekly with plan to further increase to 1 mg for weight loss benefits as well after 4 weeks if she tolerates the 0.5 dosing.

## 2022-03-05 NOTE — Assessment & Plan Note (Signed)
As patient is having muscle weakness with atorvastatin, will trial her on Crestor 20 mg. -Lipid panel -CMP

## 2022-03-06 LAB — COMPREHENSIVE METABOLIC PANEL
ALT: 21 IU/L (ref 0–32)
AST: 19 IU/L (ref 0–40)
Albumin/Globulin Ratio: 1.5 (ref 1.2–2.2)
Albumin: 3.9 g/dL (ref 3.9–4.9)
Alkaline Phosphatase: 109 IU/L (ref 44–121)
BUN/Creatinine Ratio: 20 (ref 12–28)
BUN: 21 mg/dL (ref 8–27)
Bilirubin Total: <0.2 mg/dL (ref 0.0–1.2)
CO2: 24 mmol/L (ref 20–29)
Calcium: 9.5 mg/dL (ref 8.7–10.3)
Chloride: 102 mmol/L (ref 96–106)
Creatinine, Ser: 1.04 mg/dL — ABNORMAL HIGH (ref 0.57–1.00)
Globulin, Total: 2.6 g/dL (ref 1.5–4.5)
Glucose: 105 mg/dL — ABNORMAL HIGH (ref 70–99)
Potassium: 4.5 mmol/L (ref 3.5–5.2)
Sodium: 138 mmol/L (ref 134–144)
Total Protein: 6.5 g/dL (ref 6.0–8.5)
eGFR: 61 mL/min/{1.73_m2} (ref >59)

## 2022-03-06 LAB — LIPID PANEL
Chol/HDL Ratio: 4.6 ratio — ABNORMAL HIGH (ref 0.0–4.4)
Cholesterol, Total: 302 mg/dL — ABNORMAL HIGH (ref 100–199)
HDL: 65 mg/dL (ref >39)
LDL Chol Calc (NIH): 211 mg/dL — ABNORMAL HIGH (ref 0–99)
Triglycerides: 141 mg/dL (ref 0–149)
VLDL Cholesterol Cal: 26 mg/dL (ref 5–40)

## 2022-03-09 ENCOUNTER — Telehealth: Payer: 59 | Admitting: Physician Assistant

## 2022-03-09 DIAGNOSIS — I10 Essential (primary) hypertension: Secondary | ICD-10-CM

## 2022-03-09 MED ORDER — AMLODIPINE BESYLATE 5 MG PO TABS: 5.0000 mg | ORAL_TABLET | Freq: Every evening | ORAL | 0 refills | Status: DC

## 2022-03-09 NOTE — Patient Instructions (Signed)
Shelley Klein, thank you for joining Mar Daring, PA-C for today's virtual visit.  While this provider is not your primary care provider (PCP), if your PCP is located in our provider database this encounter information will be shared with them immediately following your visit.  Consent: (Patient) Shelley Klein provided verbal consent for this virtual visit at the beginning of the encounter.  Current Medications:  Current Outpatient Medications:    amLODipine (NORVASC) 5 MG tablet, Take 1 tablet (5 mg total) by mouth at bedtime., Disp: 30 tablet, Rfl: 0   cetirizine (ZYRTEC ALLERGY) 10 MG tablet, Take 1 tablet (10 mg total) by mouth daily., Disp:  , Rfl:    JARDIANCE 10 MG TABS tablet, TAKE 1 TABLET BY MOUTH EVERY DAY, Disp: 90 tablet, Rfl: 0   rosuvastatin (CRESTOR) 20 MG tablet, Take 1 tablet (20 mg total) by mouth daily., Disp: 90 tablet, Rfl: 3   Semaglutide,0.25 or 0.'5MG'$ /DOS, (OZEMPIC, 0.25 OR 0.5 MG/DOSE,) 2 MG/1.5ML SOPN, Inject 0.5 mg into the skin once a week., Disp: 1.5 mL, Rfl: 1   Medications ordered in this encounter:  Meds ordered this encounter  Medications   amLODipine (NORVASC) 5 MG tablet    Sig: Take 1 tablet (5 mg total) by mouth at bedtime.    Dispense:  30 tablet    Refill:  0    Order Specific Question:   Supervising Provider    Answer:   Chase Picket [id number]     *If you need refills on other medications prior to your next appointment, please contact your pharmacy*  Follow-Up: Call back or seek an in-person evaluation if the symptoms worsen or if the condition fails to improve as anticipated.  Other Instructions  Amlodipine Tablets What is this medication? AMLODIPINE (am LOE di peen) treats high blood pressure and prevents chest pain (angina). It works by relaxing the blood vessels, which helps decrease the amount of work your heart has to do. It belongs to a group of medications called calcium channel blockers. This medicine may be  used for other purposes; ask your health care provider or pharmacist if you have questions. COMMON BRAND NAME(S): Norvasc What should I tell my care team before I take this medication? They need to know if you have any of these conditions: Heart disease Liver disease An unusual or allergic reaction to amlodipine, other medications, foods, dyes, or preservatives Pregnant or trying to get pregnant Breast-feeding How should I use this medication? Take this medication by mouth. Take it as directed on the prescription label at the same time every day. You can take it with or without food. If it upsets your stomach, take it with food. Keep taking it unless your care team tells you to stop. Talk to your care team about the use of this medication in children. While it may be prescribed for children as young as 6 for selected conditions, precautions do apply. Overdosage: If you think you have taken too much of this medicine contact a poison control center or emergency room at once. NOTE: This medicine is only for you. Do not share this medicine with others. What if I miss a dose? If you miss a dose, take it as soon as you can. If it is almost time for your next dose, take only that dose. Do not take double or extra doses. What may interact with this medication? Clarithromycin Cyclosporine Diltiazem Itraconazole Simvastatin Tacrolimus This list may not describe all possible interactions. Give your health care provider  a list of all the medicines, herbs, non-prescription drugs, or dietary supplements you use. Also tell them if you smoke, drink alcohol, or use illegal drugs. Some items may interact with your medicine. What should I watch for while using this medication? Visit your care team for regular checks on your progress. Check your blood pressure as directed. Know what your blood pressure should be and when to contact your care team. Do not treat yourself for coughs, colds, or pain while you are  using this medication without asking your care team for advice. Some medications may increase your blood pressure. This medication may affect your coordination, reaction time, or judgment. Do not drive or operate machinery until you know how this medication affects you. Sit up or stand slowly to reduce the risk of dizzy or fainting spells. Drinking alcohol with this medication can increase the risk of these side effects. What side effects may I notice from receiving this medication? Side effects that you should report to your care team as soon as possible: Allergic reactions--skin rash, itching, hives, swelling of the face, lips, tongue, or throat Heart attack--pain or tightness in the chest, shoulders, arms, or jaw, nausea, shortness of breath, cold or clammy skin, feeling faint or lightheaded Low blood pressure--dizziness, feeling faint or lightheaded, blurry vision Worsening chest pain (angina)--pain, pressure, or tightness in the chest, neck, back, or arms Side effects that usually do not require medical attention (report these to your care team if they continue or are bothersome): Facial flushing, redness Heart palpitations--rapid, pounding, or irregular heartbeat Nausea Stomach pain Swelling of the ankles, hands, or feet This list may not describe all possible side effects. Call your doctor for medical advice about side effects. You may report side effects to FDA at 1-800-FDA-1088. Where should I keep my medication? Keep out of the reach of children and pets. Store at room temperature between 20 and 25 degrees C (68 and 77 degrees F). Protect from light and moisture. Keep the container tightly closed. Get rid of any unused medication after the expiration date. To get rid of medications that are no longer needed or have expired: Take the medication to a medication take-back program. Check with your pharmacy or law enforcement to find a location. If you cannot return the medication, check the  label or package insert to see if the medication should be thrown out in the garbage or flushed down the toilet. If you are not sure, ask your health care provider. If it is safe to put in the trash, empty the medication out of the container. Mix the medication with cat litter, dirt, coffee grounds, or other unwanted substance. Seal the mixture in a bag or container. Put it in the trash. NOTE: This sheet is a summary. It may not cover all possible information. If you have questions about this medicine, talk to your doctor, pharmacist, or health care provider.  2023 Elsevier/Gold Standard (2020-04-20 00:00:00)   DASH Eating Plan DASH stands for Dietary Approaches to Stop Hypertension. The DASH eating plan is a healthy eating plan that has been shown to: Reduce high blood pressure (hypertension). Reduce your risk for type 2 diabetes, heart disease, and stroke. Help with weight loss. What are tips for following this plan? Reading food labels Check food labels for the amount of salt (sodium) per serving. Choose foods with less than 5 percent of the Daily Value of sodium. Generally, foods with less than 300 milligrams (mg) of sodium per serving fit into this eating plan.  To find whole grains, look for the word "whole" as the first word in the ingredient list. Shopping Buy products labeled as "low-sodium" or "no salt added." Buy fresh foods. Avoid canned foods and pre-made or frozen meals. Cooking Avoid adding salt when cooking. Use salt-free seasonings or herbs instead of table salt or sea salt. Check with your health care provider or pharmacist before using salt substitutes. Do not fry foods. Cook foods using healthy methods such as baking, boiling, grilling, roasting, and broiling instead. Cook with heart-healthy oils, such as olive, canola, avocado, soybean, or sunflower oil. Meal planning  Eat a balanced diet that includes: 4 or more servings of fruits and 4 or more servings of vegetables  each day. Try to fill one-half of your plate with fruits and vegetables. 6-8 servings of whole grains each day. Less than 6 oz (170 g) of lean meat, poultry, or fish each day. A 3-oz (85-g) serving of meat is about the same size as a deck of cards. One egg equals 1 oz (28 g). 2-3 servings of low-fat dairy each day. One serving is 1 cup (237 mL). 1 serving of nuts, seeds, or beans 5 times each week. 2-3 servings of heart-healthy fats. Healthy fats called omega-3 fatty acids are found in foods such as walnuts, flaxseeds, fortified milks, and eggs. These fats are also found in cold-water fish, such as sardines, salmon, and mackerel. Limit how much you eat of: Canned or prepackaged foods. Food that is high in trans fat, such as some fried foods. Food that is high in saturated fat, such as fatty meat. Desserts and other sweets, sugary drinks, and other foods with added sugar. Full-fat dairy products. Do not salt foods before eating. Do not eat more than 4 egg yolks a week. Try to eat at least 2 vegetarian meals a week. Eat more home-cooked food and less restaurant, buffet, and fast food. Lifestyle When eating at a restaurant, ask that your food be prepared with less salt or no salt, if possible. If you drink alcohol: Limit how much you use to: 0-1 drink a day for women who are not pregnant. 0-2 drinks a day for men. Be aware of how much alcohol is in your drink. In the [place] one drink equals one 12 oz bottle of beer (355 mL), one 5 oz glass of wine (148 mL), or one 1 oz glass of hard liquor (44 mL). General information Avoid eating more than 2,300 mg of salt a day. If you have hypertension, you may need to reduce your sodium intake to 1,500 mg a day. Work with your health care provider to maintain a healthy body weight or to lose weight. Ask what an ideal weight is for you. Get at least 30 minutes of exercise that causes your heart to beat faster (aerobic exercise) most days of the week.  Activities may include walking, swimming, or biking. Work with your health care provider or dietitian to adjust your eating plan to your individual calorie needs. What foods should I eat? Fruits All fresh, dried, or frozen fruit. Canned fruit in natural juice (without added sugar). Vegetables Fresh or frozen vegetables (raw, steamed, roasted, or grilled). Low-sodium or reduced-sodium tomato and vegetable juice. Low-sodium or reduced-sodium tomato sauce and tomato paste. Low-sodium or reduced-sodium canned vegetables. Grains Whole-grain or whole-wheat bread. Whole-grain or whole-wheat pasta. Brown rice. [place]. Bulgur. Whole-grain and low-sodium cereals. Pita bread. Low-fat, low-sodium crackers. Whole-wheat flour tortillas. Meats and other proteins Skinless chicken or [place]. Ground chicken  or [place]. Pork with fat trimmed off. Fish and seafood. Egg whites. Dried beans, peas, or lentils. Unsalted nuts, nut butters, and seeds. Unsalted canned beans. Lean cuts of beef with fat trimmed off. Low-sodium, lean precooked or cured meat, such as sausages or meat loaves. Dairy Low-fat (1%) or fat-free (skim) milk. Reduced-fat, low-fat, or fat-free cheeses. Nonfat, low-sodium ricotta or cottage cheese. Low-fat or nonfat yogurt. Low-fat, low-sodium cheese. Fats and oils Soft margarine without trans fats. Vegetable oil. Reduced-fat, low-fat, or light mayonnaise and salad dressings (reduced-sodium). Canola, safflower, olive, avocado, soybean, and sunflower oils. Avocado. Seasonings and condiments Herbs. Spices. Seasoning mixes without salt. Other foods Unsalted popcorn and pretzels. Fat-free sweets. The items listed above may not be a complete list of foods and beverages you can eat. Contact a dietitian for more information. What foods should I avoid? Fruits Canned fruit in a light or heavy syrup. Fried fruit. Fruit in cream or butter sauce. Vegetables Creamed or fried vegetables. Vegetables in a  cheese sauce. Regular canned vegetables (not low-sodium or reduced-sodium). Regular canned tomato sauce and paste (not low-sodium or reduced-sodium). Regular tomato and vegetable juice (not low-sodium or reduced-sodium). Angie Fava. Olives. Grains Baked goods made with fat, such as croissants, muffins, or some breads. Dry pasta or rice meal packs. Meats and other proteins Fatty cuts of meat. Ribs. Fried meat. Berniece Salines. Bologna, salami, and other precooked or cured meats, such as sausages or meat loaves. Fat from the back of a pig (fatback). Bratwurst. Salted nuts and seeds. Canned beans with added salt. Canned or smoked fish. Whole eggs or egg yolks. Chicken or [place] with skin. Dairy Whole or 2% milk, cream, and half-and-half. Whole or full-fat cream cheese. Whole-fat or sweetened yogurt. Full-fat cheese. Nondairy creamers. Whipped toppings. Processed cheese and cheese spreads. Fats and oils Butter. Stick margarine. Lard. Shortening. Ghee. Bacon fat. Tropical oils, such as coconut, palm kernel, or palm oil. Seasonings and condiments Onion salt, garlic salt, seasoned salt, table salt, and sea salt. Worcestershire sauce. Tartar sauce. Barbecue sauce. Teriyaki sauce. Soy sauce, including reduced-sodium. Steak sauce. Canned and packaged gravies. Fish sauce. Oyster sauce. Cocktail sauce. Store-bought horseradish. Ketchup. Mustard. Meat flavorings and tenderizers. Bouillon cubes. Hot sauces. Pre-made or packaged marinades. Pre-made or packaged taco seasonings. Relishes. Regular salad dressings. Other foods Salted popcorn and pretzels. The items listed above may not be a complete list of foods and beverages you should avoid. Contact a dietitian for more information. Where to find more information National Heart, Lung, and Blood Institute: https://wilson-eaton.com/ American Heart Association: www.heart.org Academy of Nutrition and Dietetics: www.eatright.[place]: www.kidney.org Summary The DASH  eating plan is a healthy eating plan that has been shown to reduce high blood pressure (hypertension). It may also reduce your risk for type 2 diabetes, heart disease, and stroke. When on the DASH eating plan, aim to eat more fresh fruits and vegetables, whole grains, lean proteins, low-fat dairy, and heart-healthy fats. With the DASH eating plan, you should limit salt (sodium) intake to 2,300 mg a day. If you have hypertension, you may need to reduce your sodium intake to 1,500 mg a day. Work with your health care provider or dietitian to adjust your eating plan to your individual calorie needs. This information is not intended to replace advice given to you by your health care provider. Make sure you discuss any questions you have with your health care provider. Document Revised: 05/14/2019 Document Reviewed: 05/14/2019 Elsevier Patient Education  [place].    If you  have been instructed to have an in-person evaluation today at a local Urgent Care facility, please use the link below. It will take you to a list of all of our available [facility], including address, phone number and hours of operation. Please do not delay care.  [facility]  If you or a family member do not have a primary care provider, use the link below to schedule a visit and establish care. When you choose a [facility] primary care physician or advanced practice provider, you gain a long-term partner in health. Find a Primary Care Provider  Learn more about [facility] in-office and virtual care options: [facility]

## 2022-03-09 NOTE — Progress Notes (Signed)
Virtual Visit Consent   Shelley Klein, you are scheduled for a virtual visit with a [facility] provider today. Just as with appointments in the office, your consent must be obtained to participate. Your consent will be active for this visit and any virtual visit you may have with one of our providers in the next 365 days. If you have a MyChart account, a copy of this consent can be sent to you electronically.  As this is a virtual visit, video technology does not allow for your provider to perform a traditional examination. This may limit your provider's ability to fully assess your condition. If your provider identifies any concerns that need to be evaluated in person or the need to arrange testing (such as labs, EKG, etc.), we will make arrangements to do so. Although advances in technology are sophisticated, we cannot ensure that it will always work on either your end or our end. If the connection with a video visit is poor, the visit may have to be switched to a telephone visit. With either a video or telephone visit, we are not always able to ensure that we have a secure connection.  By engaging in this virtual visit, you consent to the provision of healthcare and authorize for your insurance to be billed (if applicable) for the services provided during this visit. Depending on your insurance coverage, you may receive a charge related to this service.  I need to obtain your verbal consent now. Are you willing to proceed with your visit today? Shelley Klein has provided verbal consent on 03/09/2022 for a virtual visit (video or telephone). Mar Daring, PA-C  Date: 03/09/2022 5:44 PM  Virtual Visit via Video Note   I, Mar Daring, connected with  Shelley Klein  ([id number], [birth year]) on 03/09/22 at  5:30 PM EDT by a video-enabled telemedicine application and verified that I am speaking with the correct person using two identifiers.  Location: Patient: Virtual Visit  Location Patient: Home Provider: Virtual Visit Location Provider: Home Office   I discussed the limitations of evaluation and management by telemedicine and the availability of in person appointments. The patient expressed understanding and agreed to proceed.    History of Present Illness: Shelley Klein is a 61 y.o. who identifies as a female who was assigned female at birth, and is being seen today for elevated blood pressure.  HPI: Hypertension This is a new problem. The current episode started in the past 7 days (Saw PCP on 03/05/22 and BP was 154/77; advised to monitor BP at home; does have upcoming appt with PCP on 03/15/22 for BP follow up). The problem is unchanged. The problem is uncontrolled. Pertinent negatives include no anxiety, blurred vision, chest pain, headaches, malaise/fatigue, palpitations or peripheral edema. There are no associated agents to hypertension. Risk factors for coronary artery disease include diabetes mellitus and dyslipidemia. Past treatments include nothing. The current treatment provides no improvement. There are no compliance problems.     BP 173/105 about an hour ago, 154/103   Problems:  Patient Active Problem List   Diagnosis Date Noted   Hyperlipidemia 03/05/2022   Elevated blood pressure reading 01/04/2021   Encounter to establish care 11/08/2019   Diabetes mellitus without complication ([place]) 08/65/7846   Seasonal allergies 11/02/2019    Allergies:  Allergies  Allergen Reactions   Shellfish-Derived Products Swelling    Artifical crabmeat-facial and eye swelling- pt  ? Red dye- she eats shellfish all the time with no issues  Amoxicillin-Pot Clavulanate Rash    Put allergy in wrong patient's chart. Deleted wrong entry and created this addendum to add to correct patient's chart per instructions from Kaylyn Lim, [facility] Identity Analyst    Medications:  Current Outpatient Medications:     amLODipine (NORVASC) 5 MG tablet, Take 1 tablet (5 mg total) by mouth at bedtime., Disp: 30 tablet, Rfl: 0   cetirizine (ZYRTEC ALLERGY) 10 MG tablet, Take 1 tablet (10 mg total) by mouth daily., Disp:  , Rfl:    JARDIANCE 10 MG TABS tablet, TAKE 1 TABLET BY MOUTH EVERY DAY, Disp: 90 tablet, Rfl: 0   rosuvastatin (CRESTOR) 20 MG tablet, Take 1 tablet (20 mg total) by mouth daily., Disp: 90 tablet, Rfl: 3   Semaglutide,0.25 or 0.'5MG'$ /DOS, (OZEMPIC, 0.25 OR 0.5 MG/DOSE,) 2 MG/1.5ML SOPN, Inject 0.5 mg into the skin once a week., Disp: 1.5 mL, Rfl: 1  Observations/Objective: Patient is well-developed, well-nourished in no acute distress.  Resting comfortably at home.  Head is normocephalic, atraumatic.  No labored breathing.  Speech is clear and coherent with logical content.  Patient is alert and oriented at baseline.    Assessment and Plan: 1. Primary hypertension - amLODipine (NORVASC) 5 MG tablet; Take 1 tablet (5 mg total) by mouth at bedtime.  Dispense: 30 tablet; Refill: 0  - BP running higher at home - Will add Amlodipine '5mg'$  - Low salt/ DASH diet - Follow up with PCP on Friday, 03/15/2022 - Seek immediate follow up if severe headache, double vision, intractable vomiting, dizziness, inability to walk, chest pain, shortness of breath, or numbness/tingling on one side of the body occur  Follow Up Instructions: I discussed the assessment and treatment plan with the patient. The patient was provided an opportunity to ask questions and all were answered. The patient agreed with the plan and demonstrated an understanding of the instructions.  A copy of instructions were sent to the patient via MyChart unless otherwise noted below.    The patient was advised to call back or seek an in-person evaluation if the symptoms worsen or if the condition fails to improve as anticipated.  Time:  I spent 20 minutes with the patient via telehealth technology discussing the above problems/concerns.     Mar Daring, PA-C

## 2022-03-11 ENCOUNTER — Ambulatory Visit: Payer: 59

## 2022-03-15 ENCOUNTER — Ambulatory Visit (INDEPENDENT_AMBULATORY_CARE_PROVIDER_SITE_OTHER): Payer: 59

## 2022-03-15 VITALS — BP 132/78 | HR 71

## 2022-03-15 DIAGNOSIS — Z0130 Encounter for examination of blood pressure without abnormal findings: Secondary | ICD-10-CM

## 2022-03-15 NOTE — Patient Instructions (Signed)
Your BP looks much better today. I spoke with one of our supervising doctors and they would like for you to follow up with your primary care provider in approximately 3 months. Please continue taking your amlodipine.   If you are having allergy/cold symptoms, the doctor recommends taking either allegra, zyrtec or Claritin. You can also try Guaifenesin (Mucinex). If your symptoms are persistent, please schedule an appointment for further evaluation and management.   Have a nice day!  Talbot Grumbling, RN

## 2022-03-15 NOTE — Progress Notes (Signed)
Patient here today for BP check.      Last BP was on 03/05/2022 and was 154/77.  BP today is 132/78 with a pulse of 71.    Checked BP in right arm with large adult cuff.    Symptoms present: none.   Patient last took amlodipine last night. Patient reports that during season change, she will have issues with allergies and cold symptoms. She has been using aleve D. Spoke with Dr. Andria Frames regarding this medication. He recommended that patient avoid this medication. Recommended allegra, zyrtec or claritin and mucinex. Provided patient with recommendations. Advised that if she had persistent symptoms and if medications do not work, she should schedule appointment for follow up.   Advised that she schedule follow up for BP and diabetes in December.  Will route chart to PCP as FYI.   Talbot Grumbling, RN

## 2022-04-04 ENCOUNTER — Other Ambulatory Visit: Payer: Self-pay | Admitting: Student

## 2022-04-04 DIAGNOSIS — E119 Type 2 diabetes mellitus without complications: Secondary | ICD-10-CM

## 2022-04-08 ENCOUNTER — Other Ambulatory Visit: Payer: Self-pay | Admitting: Student

## 2022-04-08 DIAGNOSIS — I10 Essential (primary) hypertension: Secondary | ICD-10-CM

## 2022-04-08 MED ORDER — AMLODIPINE BESYLATE 5 MG PO TABS: 5.0000 mg | ORAL_TABLET | Freq: Every evening | ORAL | 0 refills | Status: DC

## 2022-05-02 ENCOUNTER — Other Ambulatory Visit: Payer: Self-pay | Admitting: Student

## 2022-05-02 DIAGNOSIS — E119 Type 2 diabetes mellitus without complications: Secondary | ICD-10-CM

## 2022-05-04 ENCOUNTER — Other Ambulatory Visit: Payer: Self-pay | Admitting: Student

## 2022-05-04 DIAGNOSIS — I10 Essential (primary) hypertension: Secondary | ICD-10-CM

## 2022-05-04 DIAGNOSIS — E119 Type 2 diabetes mellitus without complications: Secondary | ICD-10-CM

## 2022-05-06 MED ORDER — EMPAGLIFLOZIN 10 MG PO TABS: 10.0000 mg | ORAL_TABLET | Freq: Every day | ORAL | 3 refills | Status: DC

## 2022-05-06 MED ORDER — AMLODIPINE BESYLATE 5 MG PO TABS: 5.0000 mg | ORAL_TABLET | Freq: Every evening | ORAL | 0 refills | Status: DC

## 2022-05-26 ENCOUNTER — Encounter: Payer: Self-pay | Admitting: Student

## 2022-06-11 ENCOUNTER — Ambulatory Visit (INDEPENDENT_AMBULATORY_CARE_PROVIDER_SITE_OTHER): Payer: 59 | Admitting: Student

## 2022-06-11 ENCOUNTER — Encounter: Payer: Self-pay | Admitting: Student

## 2022-06-11 VITALS — BP 139/78 | HR 73 | Wt 243.0 lb

## 2022-06-11 DIAGNOSIS — R030 Elevated blood-pressure reading, without diagnosis of hypertension: Secondary | ICD-10-CM | POA: Diagnosis not present

## 2022-06-11 DIAGNOSIS — E119 Type 2 diabetes mellitus without complications: Secondary | ICD-10-CM | POA: Diagnosis not present

## 2022-06-11 LAB — POCT GLYCOSYLATED HEMOGLOBIN (HGB A1C): HbA1c, POC (controlled diabetic range): 6.8 % (ref 0.0–7.0)

## 2022-06-11 MED ORDER — SEMAGLUTIDE (1 MG/DOSE) 4 MG/3ML ~~LOC~~ SOPN: 1.0000 mg | PEN_INJECTOR | SUBCUTANEOUS | 1 refills | Status: DC

## 2022-06-11 NOTE — Progress Notes (Unsigned)
    SUBJECTIVE:   CHIEF COMPLAINT / HPI:   T2DM Last A1c 7.5 on 03/05/22. A1c today 6.8. Currently taking Ozempic 0.5 mg weekly and Jardiance 10 mg daily. Is tolerating Ozempic well, would like to increase to 1 mg.   HTN Taking Amlodipine 5 mg daily. BP at home in 120's/80's.   PERTINENT  PMH / PSH: HTN, T2DM  OBJECTIVE:   Vitals:   06/11/22 1520  BP: 139/78  Pulse: 73  SpO2: 98%     General: NAD, pleasant, able to participate in exam Cardiac: RRR, no murmurs. Respiratory: CTAB, normal effort, No wheezes, rales or rhonchi Extremities: no edema or cyanosis. Skin: warm and dry, no rashes noted Neuro: alert, no obvious focal deficits Psych: Normal affect and mood  ASSESSMENT/PLAN:   No problem-specific Assessment & Plan notes found for this encounter.     Dr. [name], [place]    {    This will disappear when note is signed, click to select method of visit    :1}

## 2022-06-11 NOTE — Patient Instructions (Signed)
It was great to see you! Thank you for allowing me to participate in your care!   Our plans for today:  - I sent in a prescription to increase Ozempic to 1 mg. If you tolerate this well after 4 weeks, please let me know and we can increase to 2 mg weekly.  - Please return in 6 months for a follow up visit  Take care and seek immediate care sooner if you develop any concerns.   Dr. [name], DO [facility]

## 2022-06-12 NOTE — Assessment & Plan Note (Signed)
Blood pressure well-controlled -Continue amlodipine 5 mg daily

## 2022-06-12 NOTE — Assessment & Plan Note (Signed)
Continue Jardiance 10 mg daily, increase Ozempic to 1 mg injected weekly. -If tolerating well, can increase Ozempic to 2 mg in 4 weeks -As A1c is under 7 and Ozempic is being increased, we will hold off and check A1c again in 6 months

## 2022-07-02 ENCOUNTER — Other Ambulatory Visit: Payer: Self-pay | Admitting: Student

## 2022-07-02 DIAGNOSIS — I10 Essential (primary) hypertension: Secondary | ICD-10-CM

## 2022-07-02 DIAGNOSIS — E119 Type 2 diabetes mellitus without complications: Secondary | ICD-10-CM

## 2022-08-27 ENCOUNTER — Other Ambulatory Visit: Payer: Self-pay | Admitting: Student

## 2022-08-27 ENCOUNTER — Encounter: Payer: Self-pay | Admitting: Student

## 2022-08-27 DIAGNOSIS — E119 Type 2 diabetes mellitus without complications: Secondary | ICD-10-CM

## 2022-08-27 MED ORDER — OZEMPIC (2 MG/DOSE) 8 MG/3ML ~~LOC~~ SOPN: 2.0000 mg | PEN_INJECTOR | SUBCUTANEOUS | 3 refills | Status: DC

## 2022-10-27 ENCOUNTER — Other Ambulatory Visit: Payer: Self-pay | Admitting: Student

## 2022-10-27 DIAGNOSIS — I10 Essential (primary) hypertension: Secondary | ICD-10-CM

## 2022-12-06 ENCOUNTER — Other Ambulatory Visit: Payer: Self-pay | Admitting: Family Medicine

## 2022-12-06 DIAGNOSIS — Z1231 Encounter for screening mammogram for malignant neoplasm of breast: Secondary | ICD-10-CM

## 2022-12-19 ENCOUNTER — Other Ambulatory Visit: Payer: Self-pay | Admitting: Student

## 2022-12-19 DIAGNOSIS — E119 Type 2 diabetes mellitus without complications: Secondary | ICD-10-CM

## 2023-01-01 LAB — HM DIABETES EYE EXAM

## 2023-01-22 ENCOUNTER — Ambulatory Visit
Admission: RE | Admit: 2023-01-22 | Discharge: 2023-01-22 | Disposition: A | Discharge status: Home / Self Care | Payer: 59 | Source: Ambulatory Visit | Attending: Family Medicine | Admitting: Family Medicine

## 2023-01-22 DIAGNOSIS — Z1231 Encounter for screening mammogram for malignant neoplasm of breast: Secondary | ICD-10-CM

## 2023-01-24 ENCOUNTER — Other Ambulatory Visit: Payer: Self-pay | Admitting: Student

## 2023-01-24 DIAGNOSIS — I10 Essential (primary) hypertension: Secondary | ICD-10-CM

## 2023-02-20 ENCOUNTER — Other Ambulatory Visit: Payer: Self-pay | Admitting: Student

## 2023-02-20 DIAGNOSIS — E785 Hyperlipidemia, unspecified: Secondary | ICD-10-CM

## 2023-04-02 ENCOUNTER — Other Ambulatory Visit: Payer: Self-pay | Admitting: Student

## 2023-04-02 DIAGNOSIS — E119 Type 2 diabetes mellitus without complications: Secondary | ICD-10-CM

## 2023-04-22 ENCOUNTER — Other Ambulatory Visit: Payer: Self-pay | Admitting: Student

## 2023-04-22 DIAGNOSIS — I10 Essential (primary) hypertension: Secondary | ICD-10-CM

## 2023-05-08 ENCOUNTER — Other Ambulatory Visit: Payer: Self-pay | Admitting: Student

## 2023-05-08 DIAGNOSIS — E119 Type 2 diabetes mellitus without complications: Secondary | ICD-10-CM

## 2023-05-08 DIAGNOSIS — I10 Essential (primary) hypertension: Secondary | ICD-10-CM

## 2023-07-11 NOTE — Progress Notes (Unsigned)
    SUBJECTIVE:   CHIEF COMPLAINT / HPI:   T2DM- taking Jardiance and Ozempic, tolerating well. No abdominal pain or GI side effects. Checks her BG at home and runs in 90s. Denies low blood sugar, shaking, dizziness, or hypoglycemia. Had eye exam last July. Due for foot exam- denies numbness/lesions on feet, does not want to take off boots today for exam. Due for urine microalbumin.   HTN- takes amlodipine 5 mg daily. Denies HA, chest pain, shortness of breath, or orthostasis symptoms. Her BP at home runs 130s/80s.  HLD- taking rosuvastatin 20mg  daily. NO side effects.   HCM Due for pneumonia, flu, COVID vaccine- she declines today Due for pap smear- she would like to schedule f/u with PCP for annual wellness to get this done  PERTINENT  PMH / PSH: T2DM, allergies, HTN, HLD  OBJECTIVE:   BP 130/84   Pulse 69   Ht 5\' 3"  (1.6 m)   Wt 218 lb 9.6 oz (99.2 kg)   SpO2 97%   BMI 38.72 kg/m   General: A&O, NAD HEENT: No sign of trauma, EOM grossly intact Cardiac: RRR, no m/r/g Respiratory: CTAB, normal WOB, no w/c/r GI: Soft, NTTP, non-distended  Extremities: NTTP, no peripheral edema. Neuro: Normal gait, moves all four extremities appropriately. Psych: Appropriate mood and affect   ASSESSMENT/PLAN:   Assessment & Plan Primary hypertension At goal, continue home amlodipine Consider ARB for renal protection in future Diabetes mellitus without complication (HCC) A1c today 6.1, well controlled, continue Jardiace and Ozempic tolerating well Due for foot exam- declined today and will follow with PCP Urine microalbumin today- could consider ARB for renal protection Offered pneumonia vaccine, pt declined UTD on eye exam Hyperlipidemia, unspecified hyperlipidemia type Lipid panel today, continue statin   Declined flu shot, COVID booster, pneumonia vaccine today Offered pap and pt declines today, Recommend f/u in next month with PCP for pap smear.   Billey Co, MD [facility]  [facility]

## 2023-07-14 ENCOUNTER — Ambulatory Visit (INDEPENDENT_AMBULATORY_CARE_PROVIDER_SITE_OTHER): Payer: 59 | Admitting: Family Medicine

## 2023-07-14 ENCOUNTER — Encounter: Payer: Self-pay | Admitting: Family Medicine

## 2023-07-14 VITALS — BP 130/84 | HR 69 | Ht 63.0 in | Wt 218.6 lb

## 2023-07-14 DIAGNOSIS — E119 Type 2 diabetes mellitus without complications: Secondary | ICD-10-CM | POA: Diagnosis not present

## 2023-07-14 DIAGNOSIS — E785 Hyperlipidemia, unspecified: Secondary | ICD-10-CM | POA: Diagnosis not present

## 2023-07-14 DIAGNOSIS — I10 Essential (primary) hypertension: Secondary | ICD-10-CM | POA: Diagnosis not present

## 2023-07-14 DIAGNOSIS — Z7985 Long-term (current) use of injectable non-insulin antidiabetic drugs: Secondary | ICD-10-CM

## 2023-07-14 LAB — POCT GLYCOSYLATED HEMOGLOBIN (HGB A1C): HbA1c, POC (controlled diabetic range): 6.1 % (ref 0.0–7.0)

## 2023-07-14 NOTE — Patient Instructions (Addendum)
It was wonderful to see you today.  Please bring ALL of your medications with you to every visit.   Today we talked about:  You Hgb A1c was 6.1, this is great!!! We will continue your current medications.  We will check your urine for protein and bloodwork to check your kidneys and cholesterol today.  If you decide you want a pneumonia vaccine, flu shot, or COVID booster you can call and schedule at any time.  Please make up a follow up in the next month to see your PCP to do your pap smear and foot exam!  Thank you for choosing [facility].   Please call [phone] with any questions about today's appointment.  Please arrive at least 15 minutes prior to your scheduled appointments.   If you had blood work today, I will send you a MyChart message or a letter if results are normal. Otherwise, I will give you a call.   If you had a referral placed, they will call you to set up an appointment. Please give [place] a call if you don't hear back in the next 2 weeks.   If you need additional refills before your next appointment, please call your pharmacy first.   Burley Saver, MD  Family Medicine

## 2023-07-14 NOTE — Assessment & Plan Note (Addendum)
A1c today 6.1, well controlled, continue Jardiace and Ozempic tolerating well Due for foot exam- declined today and will follow with PCP Urine microalbumin today- could consider ARB for renal protection Offered pneumonia vaccine, pt declined UTD on eye exam

## 2023-07-14 NOTE — Assessment & Plan Note (Signed)
Lipid panel today, continue statin

## 2023-07-15 ENCOUNTER — Encounter: Payer: Self-pay | Admitting: Family Medicine

## 2023-07-15 LAB — LIPID PANEL
Chol/HDL Ratio: 2.7 {ratio} (ref 0.0–4.4)
Cholesterol, Total: 187 mg/dL (ref 100–199)
HDL: 69 mg/dL (ref >39)
LDL Chol Calc (NIH): 106 mg/dL — ABNORMAL HIGH (ref 0–99)
Triglycerides: 65 mg/dL (ref 0–149)
VLDL Cholesterol Cal: 12 mg/dL (ref 5–40)

## 2023-07-15 LAB — BASIC METABOLIC PANEL
BUN/Creatinine Ratio: 15 (ref 12–28)
BUN: 13 mg/dL (ref 8–27)
CO2: 24 mmol/L (ref 20–29)
Calcium: 9.6 mg/dL (ref 8.7–10.3)
Chloride: 104 mmol/L (ref 96–106)
Creatinine, Ser: 0.85 mg/dL (ref 0.57–1.00)
Glucose: 89 mg/dL (ref 70–99)
Potassium: 4.3 mmol/L (ref 3.5–5.2)
Sodium: 141 mmol/L (ref 134–144)
eGFR: 77 mL/min/{1.73_m2} (ref >59)

## 2023-07-15 LAB — MICROALBUMIN / CREATININE URINE RATIO
Creatinine, Urine: 142.1 mg/dL
Microalb/Creat Ratio: <2 mg/g{creat} (ref 0–29)
Microalbumin, Urine: <3 ug/mL

## 2023-07-27 ENCOUNTER — Other Ambulatory Visit: Payer: Self-pay | Admitting: Student

## 2023-07-27 DIAGNOSIS — E119 Type 2 diabetes mellitus without complications: Secondary | ICD-10-CM

## 2023-08-11 ENCOUNTER — Encounter: Payer: Self-pay | Admitting: Family Medicine

## 2023-08-11 ENCOUNTER — Other Ambulatory Visit (HOSPITAL_COMMUNITY)
Admission: RE | Admit: 2023-08-11 | Discharge: 2023-08-11 | Disposition: A | Discharge status: Home / Self Care | Payer: 59 | Source: Ambulatory Visit | Attending: Family Medicine | Admitting: Family Medicine

## 2023-08-11 ENCOUNTER — Ambulatory Visit (INDEPENDENT_AMBULATORY_CARE_PROVIDER_SITE_OTHER): Payer: 59 | Admitting: Family Medicine

## 2023-08-11 VITALS — BP 138/79 | HR 78 | Ht 63.0 in | Wt 220.6 lb

## 2023-08-11 DIAGNOSIS — Z124 Encounter for screening for malignant neoplasm of cervix: Secondary | ICD-10-CM | POA: Diagnosis present

## 2023-08-11 NOTE — Patient Instructions (Signed)
 It was great to see you today! Thank you for choosing [facility] for your primary care. Shelley Klein was seen for Pap smear, foot exam.  Today we addressed: I will let you know the results of your Pap smear, these typically take a few days.  If you haven't already, sign up for My Chart to have easy access to your labs results, and communication with your primary care physician.  Call the clinic at [phone] if your symptoms worsen or you have any concerns.  You should return to our clinic in 6 months Please arrive 15 minutes before your appointment to ensure smooth check in process.  We appreciate your efforts in making this happen.  Thank you for allowing me to participate in your care, Para March, DO 08/11/2023, 1:34 PM PGY-1, [facility]

## 2023-08-11 NOTE — Progress Notes (Signed)
    SUBJECTIVE:   CHIEF COMPLAINT / HPI:   Patient here for Pap smear and foot exam related to her diabetes  Discussed that it is appropriate to continue dietary modifications for cholesterol and not increase her Crestor at this time  PERTINENT  PMH / PSH:  HTN - amlodipine 5mg  daily HLD - crestor 20mg  daily DM - semaglutide 2mg  weekly, jardiance 10mg  daily. A1C 6.1 06/2023  OBJECTIVE:   BP 138/79   Pulse 78   Ht 5\' 3"  (1.6 m)   Wt 220 lb 9.6 oz (100.1 kg)   SpO2 97%   BMI 39.08 kg/m   Pelvic - VULVA: normal appearing vulva with no masses, tenderness or lesions  VAGINA: normal appearing vagina with normal color and discharge, no lesions  CERVIX: normal appearing cervix without discharge or lesions, no CMT  Thin prep pap is done with HR HPV cotesting  UTERUS: uterus is felt to be normal size, shape, consistency and nontender   ADNEXA: No adnexal masses or tenderness noted. Foot exam - normal sensation, normal monofilament testing, no ulceration or skin breakdown  ASSESSMENT/PLAN:   Encounter for Papanicolaou smear for cervical cancer screening Pap smear completed today, last Pap smear was normal     Para March, DO [facility]

## 2023-08-11 NOTE — Assessment & Plan Note (Signed)
 Pap smear completed today, last Pap smear was normal

## 2023-08-12 LAB — CYTOLOGY - PAP
Adequacy: ABSENT
Comment: NEGATIVE
Diagnosis: NEGATIVE
High risk HPV: NEGATIVE

## 2023-08-13 ENCOUNTER — Encounter: Payer: Self-pay | Admitting: Family Medicine

## 2023-09-24 ENCOUNTER — Encounter: Payer: Self-pay | Admitting: Student

## 2023-09-24 ENCOUNTER — Other Ambulatory Visit: Payer: Self-pay | Admitting: Student

## 2023-09-24 DIAGNOSIS — E119 Type 2 diabetes mellitus without complications: Secondary | ICD-10-CM

## 2023-10-07 ENCOUNTER — Telehealth: Payer: Self-pay | Admitting: Student

## 2023-10-07 NOTE — Addendum Note (Signed)
 Addended by: Avanell Bob E on: 10/07/2023 04:13 PM   Modules accepted: Level of Service

## 2023-10-07 NOTE — Telephone Encounter (Signed)
 Patient walks in regarding a bill she received from her visit on 07/14/2023. Patient says the visit was supposed to be her annual physical, so insurance would have covered it in full. Patient was actually billed as an office visit, instead of a preventative. Routing to Dr. Drue Gerald, who saw patient on this DOS.   Dr. Drue Gerald - can you go in and change the charge for this visit to a preventative and I can resubmit the charge.

## 2023-10-12 ENCOUNTER — Other Ambulatory Visit: Payer: Self-pay | Admitting: Student

## 2023-10-12 DIAGNOSIS — E119 Type 2 diabetes mellitus without complications: Secondary | ICD-10-CM

## 2023-10-13 ENCOUNTER — Other Ambulatory Visit: Payer: Self-pay | Admitting: Student

## 2023-10-13 ENCOUNTER — Encounter: Payer: Self-pay | Admitting: Student

## 2023-10-13 DIAGNOSIS — I10 Essential (primary) hypertension: Secondary | ICD-10-CM

## 2023-11-15 ENCOUNTER — Other Ambulatory Visit: Payer: Self-pay | Admitting: Student

## 2023-11-15 DIAGNOSIS — E119 Type 2 diabetes mellitus without complications: Secondary | ICD-10-CM

## 2023-12-02 ENCOUNTER — Encounter: Payer: Self-pay | Admitting: *Deleted

## 2024-01-05 ENCOUNTER — Other Ambulatory Visit: Payer: Self-pay | Admitting: Family Medicine

## 2024-01-05 DIAGNOSIS — Z1231 Encounter for screening mammogram for malignant neoplasm of breast: Secondary | ICD-10-CM

## 2024-01-12 ENCOUNTER — Other Ambulatory Visit: Payer: Self-pay

## 2024-01-12 DIAGNOSIS — I10 Essential (primary) hypertension: Secondary | ICD-10-CM

## 2024-01-12 MED ORDER — AMLODIPINE BESYLATE 5 MG PO TABS: 5.0000 mg | ORAL_TABLET | Freq: Every day | ORAL | 2 refills | Status: AC

## 2024-01-16 ENCOUNTER — Other Ambulatory Visit: Payer: Self-pay

## 2024-01-16 DIAGNOSIS — E119 Type 2 diabetes mellitus without complications: Secondary | ICD-10-CM

## 2024-01-16 MED ORDER — OZEMPIC (2 MG/DOSE) 8 MG/3ML ~~LOC~~ SOPN: 2.0000 mg | PEN_INJECTOR | SUBCUTANEOUS | 1 refills | Status: DC

## 2024-01-27 ENCOUNTER — Ambulatory Visit
Admission: RE | Admit: 2024-01-27 | Discharge: 2024-01-27 | Disposition: A | Discharge status: Home / Self Care | Source: Ambulatory Visit | Attending: Family Medicine | Admitting: Family Medicine

## 2024-01-27 DIAGNOSIS — Z1231 Encounter for screening mammogram for malignant neoplasm of breast: Secondary | ICD-10-CM

## 2024-02-09 ENCOUNTER — Other Ambulatory Visit: Payer: Self-pay

## 2024-02-09 DIAGNOSIS — E785 Hyperlipidemia, unspecified: Secondary | ICD-10-CM

## 2024-02-09 MED ORDER — ROSUVASTATIN CALCIUM 20 MG PO TABS
20.0000 mg | ORAL_TABLET | Freq: Every day | ORAL | 3 refills | Status: AC
Start: 2024-02-09 — End: ?

## 2024-02-14 ENCOUNTER — Other Ambulatory Visit: Payer: Self-pay | Admitting: Family Medicine

## 2024-02-14 DIAGNOSIS — E119 Type 2 diabetes mellitus without complications: Secondary | ICD-10-CM

## 2024-02-16 ENCOUNTER — Other Ambulatory Visit: Payer: Self-pay

## 2024-02-16 DIAGNOSIS — E119 Type 2 diabetes mellitus without complications: Secondary | ICD-10-CM

## 2024-02-16 MED ORDER — EMPAGLIFLOZIN 10 MG PO TABS: 10.0000 mg | ORAL_TABLET | Freq: Every day | ORAL | 0 refills | Status: DC

## 2024-03-16 ENCOUNTER — Other Ambulatory Visit: Payer: Self-pay | Admitting: Family Medicine

## 2024-03-16 DIAGNOSIS — E119 Type 2 diabetes mellitus without complications: Secondary | ICD-10-CM

## 2024-04-29 ENCOUNTER — Other Ambulatory Visit: Payer: Self-pay | Admitting: Family Medicine

## 2024-04-29 DIAGNOSIS — E119 Type 2 diabetes mellitus without complications: Secondary | ICD-10-CM

## 2024-08-13 ENCOUNTER — Encounter: Payer: Self-pay | Admitting: Family Medicine
